# Patient Record
Sex: Male | Born: 1990
Health system: Southern US, Community
[De-identification: ages and names within clinical notes are randomized; demographics above are authoritative.]

## PROBLEM LIST (undated history)

## (undated) DIAGNOSIS — K5792 Diverticulitis of intestine, part unspecified, without perforation or abscess without bleeding: Secondary | ICD-10-CM

## (undated) DIAGNOSIS — I2699 Other pulmonary embolism without acute cor pulmonale: Secondary | ICD-10-CM

---

## 2012-10-06 ENCOUNTER — Emergency Department (HOSPITAL_BASED_OUTPATIENT_CLINIC_OR_DEPARTMENT_OTHER)
Admission: EM | Admit: 2012-10-06 | Discharge: 2012-10-06 | Disposition: A | Discharge status: Home / Self Care | Payer: Managed Care, Other (non HMO) | Attending: Emergency Medicine | Admitting: Emergency Medicine

## 2012-10-06 ENCOUNTER — Encounter (HOSPITAL_BASED_OUTPATIENT_CLINIC_OR_DEPARTMENT_OTHER): Payer: Self-pay | Admitting: *Deleted

## 2012-10-06 DIAGNOSIS — F17200 Nicotine dependence, unspecified, uncomplicated: Secondary | ICD-10-CM | POA: Insufficient documentation

## 2012-10-06 DIAGNOSIS — Z792 Long term (current) use of antibiotics: Secondary | ICD-10-CM | POA: Insufficient documentation

## 2012-10-06 DIAGNOSIS — K029 Dental caries, unspecified: Secondary | ICD-10-CM | POA: Insufficient documentation

## 2012-10-06 MED ORDER — OXYCODONE-ACETAMINOPHEN 5-325 MG PO TABS: 2.0000 | ORAL_TABLET | ORAL | Status: DC | PRN

## 2012-10-06 MED ORDER — PENICILLIN V POTASSIUM 500 MG PO TABS: 500.0000 mg | ORAL_TABLET | Freq: Three times a day (TID) | ORAL | Status: DC

## 2012-10-06 NOTE — ED Notes (Signed)
Pt. C/o upper right tooth hurting that started today. Denies any fevers. Took aleve one hour pta without relief. States he has issues on and off for past 8 months with the tooth.

## 2012-10-06 NOTE — ED Provider Notes (Signed)
History   This chart was scribed for Douglas Bucco, MD by Albertha Ghee Rifaie. This patient was seen in room MH10/MH10 and the patient's care was started at 9:47PM.    CSN: [id number]  Arrival date & time 10/06/12  2127   First MD Initiated Contact with Patient 10/06/12 2147      Chief Complaint  Patient presents with  . Dental Pain     The history is provided by the patient. No language interpreter was used.    [name] is a 21 y.o. male who presents to the Emergency Department complaining of unchanged constant, non-radiating right upper tooth pain onset two weeks ago. However pain got sever for the past 4 hours. He denies fever, chills, emesis, nausea, and any other chronic health problems. Patient is a current everyday smoker but denies alcohol use.   History reviewed. No pertinent past medical history.  History reviewed. No pertinent past surgical history.  No family history on file.  History  Substance Use Topics  . Smoking status: Current Every Day Smoker  . Smokeless tobacco: Not on file  . Alcohol Use: No      Review of Systems  Constitutional: Negative for fever, chills, diaphoresis and fatigue.  HENT: Negative for congestion, rhinorrhea and sneezing.   Eyes: Negative.   Respiratory: Negative for cough, chest tightness and shortness of breath.   Cardiovascular: Negative for chest pain and leg swelling.  Gastrointestinal: Negative for nausea, vomiting, abdominal pain, diarrhea and blood in stool.  Genitourinary: Negative for frequency, hematuria, flank pain and difficulty urinating.  Musculoskeletal: Negative for back pain and arthralgias.  Skin: Negative for rash.  Neurological: Negative for dizziness, speech difficulty, weakness, numbness and headaches.    Allergies  Review of patient's allergies indicates no known allergies.  Home Medications   Current Outpatient Rx  Name  Route  Sig  Dispense  Refill  . OXYCODONE-ACETAMINOPHEN 5-325 MG PO TABS  Oral   Take 2 tablets by mouth every 4 (four) hours as needed for pain.   15 tablet   0   . PENICILLIN V POTASSIUM 500 MG PO TABS   Oral   Take 1 tablet (500 mg total) by mouth 3 (three) times daily.   30 tablet   0     BP 164/101  Pulse 101  Temp 98.1 F (36.7 C) (Oral)  Resp 20  SpO2 100%  Physical Exam  Constitutional: He is oriented to person, place, and time. He appears well-developed and well-nourished.  HENT:  Head: Normocephalic and atraumatic.       Tenderness along the right upper first molar.  No induration or fluctuance.  No trismus.   Neck: Normal range of motion. Neck supple.  Cardiovascular: Normal rate.   Pulmonary/Chest: Effort normal.  Neurological: He is alert and oriented to person, place, and time.  Skin: Skin is warm and dry.    ED Course  Procedures (including critical care time)  DIAGNOSTIC STUDIES: Oxygen Saturation is 100% on room air, normal by my interpretation.    COORDINATION OF CARE: 9:55 PM Discussed treatment plan with pt at bedside and pt agreed to plan.    Labs Reviewed - No data to display No results found.   1. Dental caries       MDM  Pt started on abx, pain meds.  Advised to f/u with denist.  No evidence of abscess     I personally performed the services described in this documentation, which was scribed in my presence.  The recorded information has been reviewed and considered.      Douglas Bucco, MD 10/06/12 [phone]

## 2014-07-05 ENCOUNTER — Emergency Department (HOSPITAL_BASED_OUTPATIENT_CLINIC_OR_DEPARTMENT_OTHER)
Admission: EM | Admit: 2014-07-05 | Discharge: 2014-07-05 | Disposition: A | Discharge status: Home / Self Care | Payer: Managed Care, Other (non HMO) | Attending: Emergency Medicine | Admitting: Emergency Medicine

## 2014-07-05 ENCOUNTER — Encounter (HOSPITAL_BASED_OUTPATIENT_CLINIC_OR_DEPARTMENT_OTHER): Payer: Self-pay | Admitting: Emergency Medicine

## 2014-07-05 ENCOUNTER — Emergency Department (HOSPITAL_BASED_OUTPATIENT_CLINIC_OR_DEPARTMENT_OTHER): Payer: Managed Care, Other (non HMO)

## 2014-07-05 DIAGNOSIS — Z7901 Long term (current) use of anticoagulants: Secondary | ICD-10-CM | POA: Insufficient documentation

## 2014-07-05 DIAGNOSIS — J189 Pneumonia, unspecified organism: Secondary | ICD-10-CM | POA: Insufficient documentation

## 2014-07-05 DIAGNOSIS — Z792 Long term (current) use of antibiotics: Secondary | ICD-10-CM | POA: Insufficient documentation

## 2014-07-05 DIAGNOSIS — R197 Diarrhea, unspecified: Secondary | ICD-10-CM | POA: Insufficient documentation

## 2014-07-05 DIAGNOSIS — R000 Tachycardia, unspecified: Secondary | ICD-10-CM | POA: Insufficient documentation

## 2014-07-05 DIAGNOSIS — R509 Fever, unspecified: Secondary | ICD-10-CM | POA: Insufficient documentation

## 2014-07-05 DIAGNOSIS — Z87891 Personal history of nicotine dependence: Secondary | ICD-10-CM | POA: Insufficient documentation

## 2014-07-05 DIAGNOSIS — Z86711 Personal history of pulmonary embolism: Secondary | ICD-10-CM | POA: Insufficient documentation

## 2014-07-05 HISTORY — DX: Other pulmonary embolism without acute cor pulmonale: I26.99

## 2014-07-05 MED ORDER — LIDOCAINE HCL (PF) 1 % IJ SOLN
INTRAMUSCULAR | Status: AC
  Administered 2014-07-05: 2.1 mL
  Filled 2014-07-05: qty 5

## 2014-07-05 MED ORDER — LEVOFLOXACIN 500 MG PO TABS: 500.0000 mg | ORAL_TABLET | Freq: Every day | ORAL | Status: DC

## 2014-07-05 MED ORDER — AZITHROMYCIN 250 MG PO TABS
500.0000 mg | ORAL_TABLET | Freq: Once | ORAL | Status: AC
  Administered 2014-07-05: 500 mg via ORAL
  Filled 2014-07-05: qty 2

## 2014-07-05 MED ORDER — CEFTRIAXONE SODIUM 1 G IJ SOLR
1.0000 g | Freq: Once | INTRAMUSCULAR | Status: AC
  Administered 2014-07-05: 1 g via INTRAMUSCULAR
  Filled 2014-07-05: qty 10

## 2014-07-05 MED ORDER — GUAIFENESIN-CODEINE 100-10 MG/5ML PO SOLN
10.0000 mL | Freq: Once | ORAL | Status: AC
  Administered 2014-07-05: 10 mL via ORAL
  Filled 2014-07-05: qty 10

## 2014-07-05 NOTE — ED Provider Notes (Signed)
CSN: [id number]     Arrival date & time 07/05/14  1531 History  This chart was scribed for Raeford Razor, MD by Phillis Haggis, ED Scribe. This patient was seen in room MH09/MH09 and patient care was started at 5:03 PM.    Chief Complaint  Patient presents with  . Fever   Patient is a 23 y.o. male presenting with fever. The history is provided by the patient. No language interpreter was used.  Fever Associated symptoms: chest pain, congestion, cough, diarrhea and sore throat   Associated symptoms: no nausea    HPI Comments: Douglas Newman is a 23 y.o. male with a history of pulmonary embolism who presents to the Emergency Department complaining of fever tmax 101F onset 1 day ago. He states that he was in the hospital for blood clots in bilateral lungs 1 week ago at [facility] in [place]. He states that he was seen at Primary Care before his blood clots and they found fluid pockets on the right lung and he was told he had pneumonia. He reports that he is on Xarelto for the blood clot symptoms. He reports associated "hard" non-productive cough, generalized chest pain, sore throat, congestion, and diarrhea. He reports that his symptoms have been improving but worsened yesterday. He denies SOB or nausea. 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   Past Medical History  Diagnosis Date  . Pulmonary embolism    History reviewed. No pertinent past surgical history. No family history on file. History  Substance Use Topics  . Smoking status: Former Games developer  . Smokeless tobacco: Not on file  . Alcohol Use: No    Review of Systems  Constitutional: Positive for fever.  HENT: Positive for congestion and sore throat.   Respiratory: Positive for cough. Negative for shortness of breath.   Cardiovascular: Positive for chest pain.  Gastrointestinal: Positive for diarrhea. Negative for nausea.  All other systems reviewed and are negative.  Allergies  Review of  patient's allergies indicates no known allergies.  Home Medications   Prior to Admission medications   Medication Sig Start Date End Date Taking? Authorizing Provider  oxycodone (OXY-IR) 5 MG capsule Take 5 mg by mouth every 4 (four) hours as needed.   Yes Historical Provider, MD  Rivaroxaban (XARELTO) 15 MG TABS tablet Take 15 mg by mouth 2 (two) times daily with a meal.   Yes Historical Provider, MD  oxyCODONE-acetaminophen (PERCOCET/ROXICET) 5-325 MG per tablet Take 2 tablets by mouth every 4 (four) hours as needed for pain. 10/06/12   Rolan Bucco, MD  penicillin v potassium (VEETID) 500 MG tablet Take 1 tablet (500 mg total) by mouth 3 (three) times daily. 10/06/12   Rolan Bucco, MD   BP 125/88  Pulse 118  Temp(Src) 98.7 F (37.1 C) (Oral)  Resp 20  Ht 5\' 11"  (1.803 m)  Wt 270 lb (122.471 kg)  BMI 37.67 kg/m2  SpO2 98% Physical Exam  Nursing note and vitals reviewed. Constitutional: He appears well-developed and well-nourished. No distress.  HENT:  Head: Normocephalic and atraumatic.  Eyes: Conjunctivae are normal. Right eye exhibits no discharge. Left eye exhibits no discharge.  Neck: Neck supple.  Cardiovascular: Normal rate, regular rhythm and normal heart sounds.  Exam reveals no gallop and no friction rub.   No murmur heard. Pulmonary/Chest: Effort normal and breath sounds normal. No respiratory distress.  Mildly tachycardic. Diminished breath sounds left base. No increased work of breathing. Speaking in  complete sentences.   Abdominal: Soft. He exhibits no distension. There is no tenderness.  Musculoskeletal: He exhibits no edema and no tenderness.  Neurological: He is alert.  Skin: Skin is warm and dry.  Psychiatric: He has a normal mood and affect. His behavior is normal. Thought content normal.    ED Course  Procedures (including critical care time) DIAGNOSTIC STUDIES: Oxygen Saturation is 98% on room air, normal by my interpretation.    COORDINATION OF  CARE: 5:07 PM-Discussed treatment plan which includes anti-biotics, Rocephin shot and f/u if symptoms worsen with pt at bedside and pt agreed to plan.   Labs Review Labs Reviewed - No data to display  Imaging Review No results found.  Dg Chest 2 View  07/05/2014   CLINICAL DATA:  History of PE, fever/cough  EXAM: CHEST  2 VIEW  COMPARISON:  05/08/2014  FINDINGS: Patchy left lower lobe opacity, suspicious for pneumonia. Given the clinical history, pulmonary infarct is also possible. Suspected associated left pleural effusion. No pneumothorax.  Heart is normal in size.  Visualized osseous structures are within normal limits.  IMPRESSION: Left lower lobe opacity, suspicious for pneumonia or pulmonary infarct.  Left pleural effusion.   Electronically Signed   By: Charline BillsSriyesh  Krishnan M.D.   On: 07/05/2014 16:38    EKG Interpretation None      MDM   Final diagnoses:  Pneumonia, organism unspecified    22yM with CP and fever. Recently diagnosed PE on xarelto. CXR with opacity. Pneumonia versus pulmonary infarct. Clinically suspect pneumonia. Given rocephin/azithromycin in ED. For HCAP though, quinolone would be better outpt tx. Not hypoxic. Doesn't appear distressed. I feel appropriate for outpt tx.     I personally preformed the services scribed in my presence. The recorded information has been reviewed is accurate. Raeford RazorStephen [place], MD.    Raeford RazorStephen Alylah Blakney, MD 07/11/14 [id number]

## 2014-07-05 NOTE — ED Notes (Signed)
Pt reports just d/c from hospital 1 week pta for new onset blood clots in bilateral lungs.  Reports has had sore throat, cough, congestion, fever up to 101F, diarrhea.  On xarelto for PEs, denies any acute sob.

## 2014-07-05 NOTE — Discharge Instructions (Signed)

## 2019-01-17 ENCOUNTER — Emergency Department (HOSPITAL_BASED_OUTPATIENT_CLINIC_OR_DEPARTMENT_OTHER)
Admission: EM | Admit: 2019-01-17 | Discharge: 2019-01-17 | Disposition: A | Discharge status: Home / Self Care | Payer: No Typology Code available for payment source | Attending: Emergency Medicine | Admitting: Emergency Medicine

## 2019-01-17 ENCOUNTER — Emergency Department (HOSPITAL_BASED_OUTPATIENT_CLINIC_OR_DEPARTMENT_OTHER): Payer: No Typology Code available for payment source

## 2019-01-17 ENCOUNTER — Encounter (HOSPITAL_BASED_OUTPATIENT_CLINIC_OR_DEPARTMENT_OTHER): Payer: Self-pay

## 2019-01-17 ENCOUNTER — Other Ambulatory Visit: Payer: Self-pay

## 2019-01-17 DIAGNOSIS — I2699 Other pulmonary embolism without acute cor pulmonale: Secondary | ICD-10-CM

## 2019-01-17 DIAGNOSIS — Z87891 Personal history of nicotine dependence: Secondary | ICD-10-CM | POA: Diagnosis not present

## 2019-01-17 DIAGNOSIS — M549 Dorsalgia, unspecified: Secondary | ICD-10-CM | POA: Diagnosis present

## 2019-01-17 DIAGNOSIS — Z79899 Other long term (current) drug therapy: Secondary | ICD-10-CM | POA: Diagnosis not present

## 2019-01-17 LAB — CBC WITH DIFFERENTIAL/PLATELET
Abs Immature Granulocytes: 0.02 10*3/uL (ref 0.00–0.07)
BASOS ABS: 0 10*3/uL (ref 0.0–0.1)
Basophils Relative: 1 %
Eosinophils Absolute: 0.1 10*3/uL (ref 0.0–0.5)
Eosinophils Relative: 2 %
HEMATOCRIT: 45.2 % (ref 39.0–52.0)
Hemoglobin: 15.3 g/dL (ref 13.0–17.0)
IMMATURE GRANULOCYTES: 0 %
LYMPHS ABS: 1.9 10*3/uL (ref 0.7–4.0)
Lymphocytes Relative: 29 %
MCH: 30.9 pg (ref 26.0–34.0)
MCHC: 33.8 g/dL (ref 30.0–36.0)
MCV: 91.3 fL (ref 80.0–100.0)
Monocytes Absolute: 0.6 10*3/uL (ref 0.1–1.0)
Monocytes Relative: 9 %
NEUTROS ABS: 3.9 10*3/uL (ref 1.7–7.7)
NRBC: 0 % (ref 0.0–0.2)
Neutrophils Relative %: 59 %
Platelets: 238 10*3/uL (ref 150–400)
RBC: 4.95 MIL/uL (ref 4.22–5.81)
RDW: 13.3 % (ref 11.5–15.5)
WBC: 6.5 10*3/uL (ref 4.0–10.5)

## 2019-01-17 LAB — COMPREHENSIVE METABOLIC PANEL
ALT: 39 U/L (ref 0–44)
AST: 31 U/L (ref 15–41)
Albumin: 4.5 g/dL (ref 3.5–5.0)
Alkaline Phosphatase: 62 U/L (ref 38–126)
Anion gap: 8 (ref 5–15)
BUN: 16 mg/dL (ref 6–20)
CO2: 24 mmol/L (ref 22–32)
CREATININE: 0.9 mg/dL (ref 0.61–1.24)
Calcium: 9.6 mg/dL (ref 8.9–10.3)
Chloride: 104 mmol/L (ref 98–111)
GFR calc non Af Amer: >60 mL/min (ref >60)
Glucose, Bld: 86 mg/dL (ref 70–99)
POTASSIUM: 3.9 mmol/L (ref 3.5–5.1)
SODIUM: 136 mmol/L (ref 135–145)
Total Bilirubin: 0.7 mg/dL (ref 0.3–1.2)
Total Protein: 7.6 g/dL (ref 6.5–8.1)

## 2019-01-17 LAB — APTT: APTT: 41 s — AB (ref 24–36)

## 2019-01-17 LAB — PROTIME-INR
INR: 1.09
PROTHROMBIN TIME: 14.1 s (ref 11.4–15.2)

## 2019-01-17 MED ORDER — IOPAMIDOL (ISOVUE-370) INJECTION 76%
100.0000 mL | Freq: Once | INTRAVENOUS | Status: AC | PRN
  Administered 2019-01-17: 100 mL via INTRAVENOUS

## 2019-01-17 MED ORDER — RIVAROXABAN (XARELTO) VTE STARTER PACK (15 & 20 MG): ORAL_TABLET | ORAL | 0 refills | Status: DC

## 2019-01-17 MED FILL — XARELTO STARTER PACK: 15 & 20 | 30 days supply | Qty: 51 | Fill #0

## 2019-01-17 NOTE — ED Provider Notes (Signed)
[facility] EMERGENCY DEPARTMENT Provider Note   CSN: [id number] Arrival date & time: 01/17/19  1141   History   Chief Complaint Chief Complaint  Patient presents with  . Back Pain   HPI Douglas Newman is a 28 y.o. male with past medical history significant for pulmonary embolism who presents for evaluation of right-sided back pain.  Patient states he has felt right-sided back pain since yesterday evening.  Patient states he has had fluttering as well as pleuritic pain to right back.  Denies cough or hemoptysis.  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  Denies fever, chills, nausea, vomiting, chest pain, shortness of breath, abdominal pain, diarrhea, dysuria.  Patient states this pain feels similar to his previous pulmonary embolism.  Patient states he was in the hospital 2 weeks for his prior PE.  States he was not told he had a blood clotting disorder.  Patient states they told him that this most likely occurred secondary to his travel.  Denies lower leg swelling, tenderness, warmth, recent surgeries.  History obtained from patient.  No interpreter was used.  HPI  Past Medical History:  Diagnosis Date  . Pulmonary embolism (HCC)     There are no active problems to display for this patient.   History reviewed. No pertinent surgical history.      Home Medications    Prior to Admission medications   Medication Sig Start Date End Date Taking? Authorizing Provider  levofloxacin (LEVAQUIN) 500 MG tablet Take 1 tablet (500 mg total) by mouth daily. 07/05/14   Raeford RazorKohut, Stephen, MD  oxycodone (OXY-IR) 5 MG capsule Take 5 mg by mouth every 4 (four) hours as needed.    [provider]  oxyCODONE-acetaminophen (PERCOCET/ROXICET) 5-325 MG per tablet Take 2 tablets by mouth every 4 (four) hours as needed for pain. 10/06/12   Rolan BuccoBelfi, Melanie, MD  penicillin v potassium (VEETID) 500 MG tablet Take 1 tablet (500 mg total) by mouth 3  (three) times daily. 10/06/12   Rolan BuccoBelfi, Melanie, MD  Rivaroxaban 15 & 20 MG TBPK Take as directed on package: Start with one 15mg  tablet by mouth twice a day with food. On Day 22, switch to one 20mg  tablet once a day with food. 01/17/19   Shihab States A, PA-C    Family History No family history on file.  Social History Social History   Tobacco Use  . Smoking status: Former Games developermoker  . Smokeless tobacco: Former Engineer, waterUser  Substance Use Topics  . Alcohol use: No  . Drug use: No     Allergies   Patient has no known allergies.   Review of Systems Review of Systems  Constitutional: Negative.   HENT: Negative.   Respiratory: Negative.        Pleuritic pain to right upper back.  Cardiovascular: Negative.   Gastrointestinal: Negative.   Genitourinary: Negative.   Musculoskeletal: Positive for back pain.  Skin: Negative.   Neurological: Negative.   All other systems reviewed and are negative.    Physical Exam Updated Vital Signs BP 100/62 (BP Location: Right Arm)   Pulse 68   Temp 98.2 F (36.8 C) (Oral)   Resp 16   Ht 6' (1.829 m)   Wt 114.3 kg   SpO2 100%   BMI 34.18 kg/m   Physical Exam Vitals signs and nursing note reviewed.  Constitutional:      General: He is not in acute distress.  Appearance: He is well-developed. He is not diaphoretic.  HENT:     Head: Normocephalic and atraumatic.     Nose: Nose normal.     Mouth/Throat:     Mouth: Mucous membranes are moist.     Pharynx: Oropharynx is clear.  Eyes:     Pupils: Pupils are equal, round, and reactive to light.  Neck:     Musculoskeletal: Normal range of motion and neck supple.  Cardiovascular:     Rate and Rhythm: Normal rate and regular rhythm.     Pulses: Normal pulses.     Heart sounds: Normal heart sounds.  Pulmonary:     Effort: Pulmonary effort is normal. No respiratory distress.     Comments: Clear to auscultation bilaterally without wheeze, rhonchi or rales.  No accessory muscle usage.  No  tachypnea. Abdominal:     General: There is no distension.     Palpations: Abdomen is soft.     Comments: Soft, nontender without rebound or guarding.  No CVA tenderness.  Musculoskeletal: Normal range of motion.     Comments: Full range of motion of the T-spine and L-spine with flexion, hyperextension, and lateral flexion. No midline tenderness or stepoffs. No tenderness to palpation of the spinous processes of the T-spine or L-spine. No tenderness to palpation of the paraspinous muscles of the L-spine  Skin:    General: Skin is warm and dry.  Neurological:     Mental Status: He is alert.     Comments: Speech is clear and goal oriented, follows commands Normal 5/5 strength in upper and lower extremities bilaterally including dorsiflexion and plantar flexion, strong and equal grip strength Sensation normal to light and sharp touch Moves extremities without ataxia, coordination intact Normal gait Normal balance No Clonus    ED Treatments / Results  Labs (all labs ordered are listed, but only abnormal results are displayed) Labs Reviewed  APTT - Abnormal; Notable for the following components:      Result Value   aPTT 41 (*)    All other components within normal limits  CBC WITH DIFFERENTIAL/PLATELET  COMPREHENSIVE METABOLIC PANEL  PROTIME-INR    EKG None  Radiology Ct Angio Chest Pe W/cm &/or Wo Cm  Result Date: 01/17/2019 CLINICAL DATA:  Right mid back pain, history of PE EXAM: CT ANGIOGRAPHY CHEST WITH CONTRAST TECHNIQUE: Multidetector CT imaging of the chest was performed using the standard protocol during bolus administration of intravenous contrast. Multiplanar CT image reconstructions and MIPs were obtained to evaluate the vascular anatomy. CONTRAST:  ISOVUE-370 IOPAMIDOL (ISOVUE-370) INJECTION 76% COMPARISON:  Chest radiograph, 07/05/2014 FINDINGS: Cardiovascular: Positive examination for pulmonary embolism. There is segmental to subsegmental embolus present in the  bilateral lung bases, most notably in the left lower lobe. Normal heart size. No pericardial effusion. RV to LV ratio < 0.9. Mediastinum/Nodes: No enlarged mediastinal, hilar, or axillary lymph nodes. Thyroid gland, trachea, and esophagus demonstrate no significant findings. Lungs/Pleura: Scar-like opacity in the bilateral lung bases distal to the small emboli, generally in keeping with appearance of prior chest radiograph dated 07/05/2014, of uncertain acuity although possibly residua of prior infarction. No pleural effusion or pneumothorax. Upper Abdomen: No acute abnormality. Musculoskeletal: No chest wall abnormality. No acute or significant osseous findings. Review of the MIP images confirms the above findings. IMPRESSION: 1. Positive examination for pulmonary embolism. There is segmental to subsegmental embolus present in the bilateral lung bases, most notably in the left lower lobe. 2. Scar-like opacity in the bilateral lung bases  distal to the small emboli, generally in keeping with appearance of prior chest radiograph dated 07/05/2014. These are of uncertain acuity although possibly residua of prior infarction. 3. Findings are overall of uncertain acuity. Comparison to prior CT angiogram, if available, would be helpful to differentiate acute and chronic embolus. 4.  No CT evidence of right heart strain. These results were called by telephone at the time of interpretation on 01/17/2019 at 1:38 pm to Dr. Ralph Leyden , who verbally acknowledged these results. Electronically Signed   By: Lauralyn Primes M.D.   On: 01/17/2019 13:38    Procedures Procedures (including critical care time)  Medications Ordered in ED Medications  iopamidol (ISOVUE-370) 76 % injection 100 mL (100 mLs Intravenous Contrast Given 01/17/19 1319)    Initial Impression / Assessment and Plan / ED Course  I have reviewed the triage vital signs and the nursing notes.  Pertinent labs & imaging results that were available during my  care of the patient were reviewed by me and considered in my medical decision making (see chart for details).  28 year old male who appears otherwise well presents for evaluation of back pain.  Afebrile, nonseptic, non-ill-appearing.  Patient states feels similar to his previous pulmonary embolism.  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  No lower extremity edema, erythema, warmth, tenderness.  No tachycardia, tachypnea or hypoxia. No chest pain. Neurovascularly intact.  Neuromusculoskeletal exam. No loss of bowel or bladder control.  No concern for cauda equina.  No fever, night sweats, weight loss, h/o cancer, IVDU. EKG pending. Will obtain labs, CT to r/o PE and reevaluate.  CBC without leukocytosis, Metabolic panel without electrolyte, liver, renal abnormalities.  CT scan showed segmental to subsegmental bilateral pulmonary embolisms with scaring distal unable to determine acuity.  No heart strain.  Patient unsure "if I want to stay in the hospital."  Consulted with attending physician Dr. Fredderick Phenix who will see and evaluate patient to determine if candidate for outpatient Xarelto therapy.   On reevaluation patient without tachycardia, tachypnea or hypoxia.  Patient has decided against inpatient management.  Discussed risk versus benefit of inpatient vs outpatient management of PE.  Patient voices understanding of risk versus benefit and has chosen outpatient therapy.  Will be started on Xarelto starter pack.  Pharmacist has discussed Xarelto with patient.  No known head bleeds or clotting disorders. Discussed strict return precautions.  Patient voiced understanding of return precautions and agreeable for follow-up.  Patient hemodynamically stable at this time.  No tachycardia, tachypnea or hypoxia department.  Patient was seen and evaluated by attending, Dr. Fredderick Phenix.  She discussed with  patient inpatient vs outpatient management of PE.  Feels patient is good candidate for outpatient therapy.  Agrees with above treatment, plan and disposition.    Final Clinical Impressions(s) / ED Diagnoses   Final diagnoses:  Pulmonary embolism without acute cor pulmonale, unspecified chronicity, unspecified pulmonary embolism type [facility])    ED Discharge Orders         Ordered    Rivaroxaban 15 & 20 MG TBPK     01/17/19 1505           Agastya Meister A, PA-C 01/17/19 1710    Rolan Bucco, MD 01/18/19 1208

## 2019-01-17 NOTE — Discharge Instructions (Addendum)
You were diagnosed with a pulmonary embolism.  Started on Xarelto.  Help with your primary care provider over the next 2 to 3 days for reevaluation.  Return to the ED for any worsening symptoms such as chest pain, shortness of breath, worsening back pain, coughing up blood, cough.   Information on my medicine - XARELTO (rivaroxaban)  This medication education was reviewed with me or my healthcare representative as part of my discharge preparation.  The pharmacist that spoke with me during my hospital stay was:  Daylene Posey, [facility]  WHY WAS Douglas Newman PRESCRIBED FOR YOU? Xarelto was prescribed to treat blood clots that may have been found in the veins of your legs (deep vein thrombosis) or in your lungs (pulmonary embolism) and to reduce the risk of them occurring again.  WHAT DO YOU NEED TO KNOW ABOUT XARELTO? The starting dose is one 15 mg tablet taken TWICE daily with food for the FIRST 21 DAYS then on 02/07/2019  the dose is changed to one 20 mg tablet taken ONCE A DAY with your evening meal.  DO NOT stop taking Xarelto without talking to the health care provider who prescribed the medication.  Refill your prescription for 20 mg tablets before you run out.  After discharge, you should have regular check-up appointments with your healthcare provider that is prescribing your Xarelto.  In the future your dose may need to be changed if your kidney function changes by a significant amount.  WHAT DO YOU DO IF YOU MISS A DOSE? If you are taking Xarelto TWICE DAILY and you miss a dose, take it as soon as you remember. You may take two 15 mg tablets (total 30 mg) at the same time then resume your regularly scheduled 15 mg twice daily the next day.  If you are taking Xarelto ONCE DAILY and you miss a dose, take it as soon as you remember on the same day then continue your regularly scheduled once daily regimen the next day. Do not take two doses of Xarelto at the same time.   IMPORTANT SAFETY  INFORMATION Xarelto is a blood thinner medicine that can cause bleeding. You should call your healthcare provider right away if you experience any of the following: Bleeding from an injury or your nose that does not stop. Unusual colored urine (red or dark brown) or unusual colored stools (red or black). Unusual bruising for unknown reasons. A serious fall or if you hit your head (even if there is no bleeding).  Some medicines may interact with Xarelto and might increase your risk of bleeding while on Xarelto. To help avoid this, consult your healthcare provider or pharmacist prior to using any new prescription or non-prescription medications, including herbals, vitamins, non-steroidal anti-inflammatory drugs (NSAIDs) and supplements.  This website has more information on Xarelto: VisitDestination.com.br.

## 2019-01-17 NOTE — ED Notes (Signed)
Pt transported to CT at this time.

## 2019-01-17 NOTE — ED Triage Notes (Signed)
Pt c/o right mid back pain that he describes as a flutter felling-feels like wne he had a PE in the past-denies as pain-NAD-steady gait

## 2019-09-03 IMAGING — CT CT ANGIO CHEST
2 of 8 series · 18 of 36 positions shown · IV contrast (iopamidol)
Comparison: Chest radiograph, 07/05/2014

CLINICAL DATA: Right mid back pain, history of PE

EXAM:
CT ANGIOGRAPHY CHEST WITH CONTRAST
TECHNIQUE: Multidetector CT imaging of the chest was performed using the
standard protocol during bolus administration of intravenous
contrast. Multiplanar CT image reconstructions and MIPs were
obtained to evaluate the vascular anatomy.
CONTRAST:  100mL Z1R011-A2P IOPAMIDOL (Z1R011-A2P) INJECTION 76%

[Series 6: pe coronal mpr · coronal · 0.54mm/px · 1 of 151 slices shown]
[im 76/151  mediastinal]
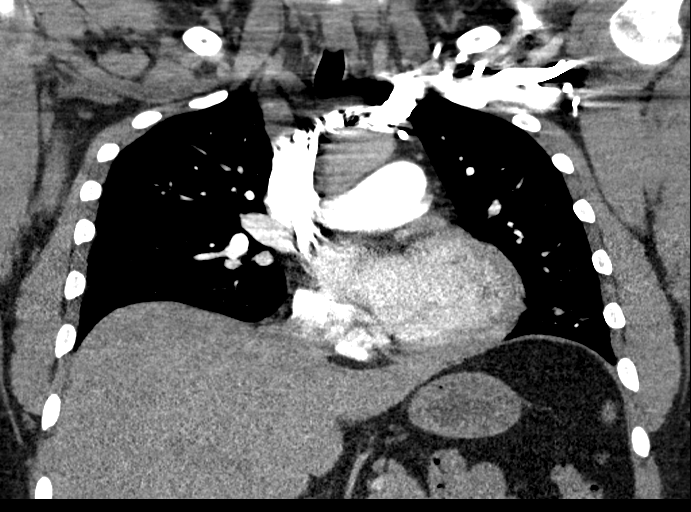

[Series 10: pe thins · axial · 0.76mm/px · z∈[+811,+1064]mm · 17 of 283 slices shown]
[im 15/283  lung]
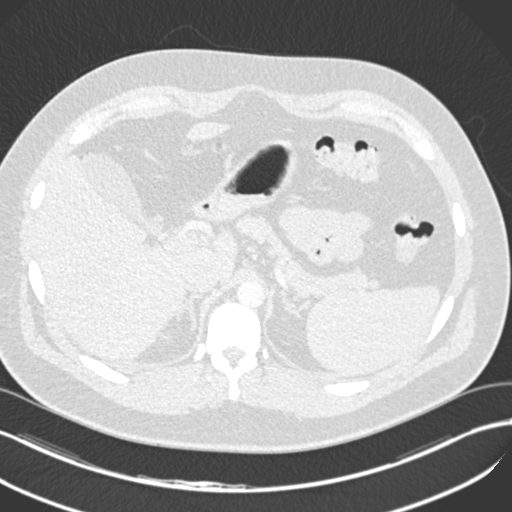
[im 30/283  mediastinal]
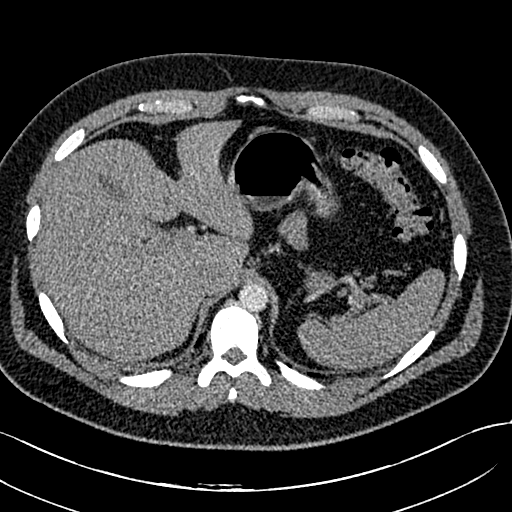
[im 45/283  lung]
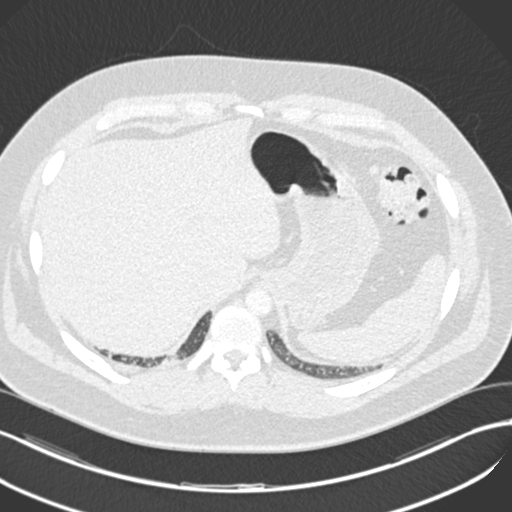
[im 60/283  mediastinal]
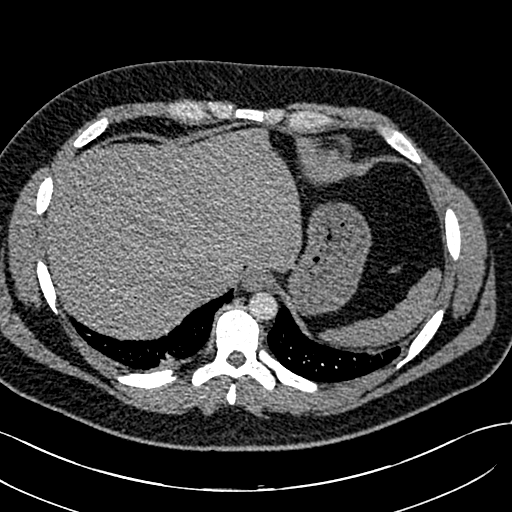
[im 75/283  lung]
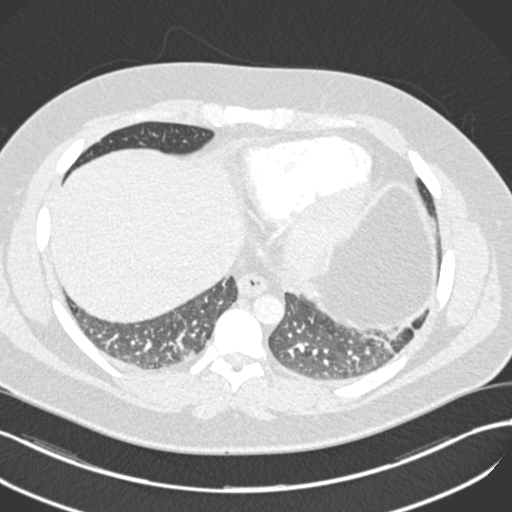
[im 90/283  mediastinal]
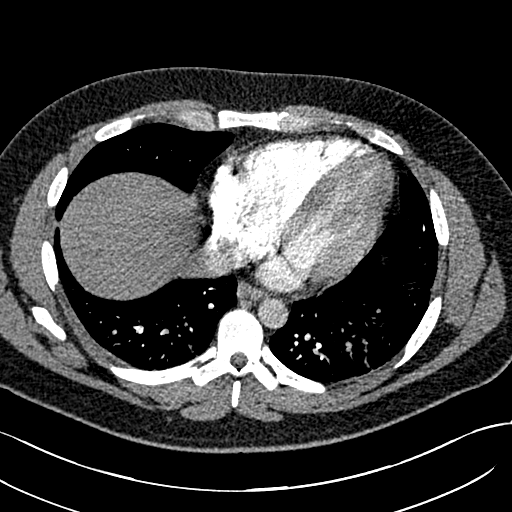
[im 104/283  lung]
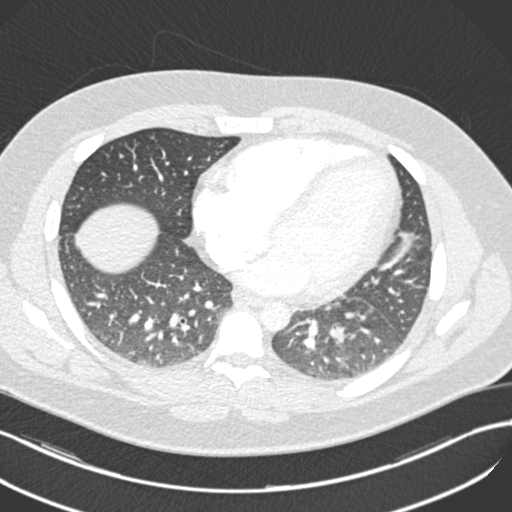
[im 119/283  mediastinal]
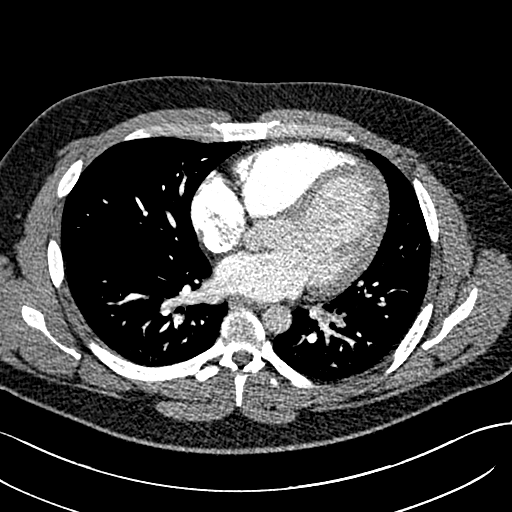
[im 149/283  lung]
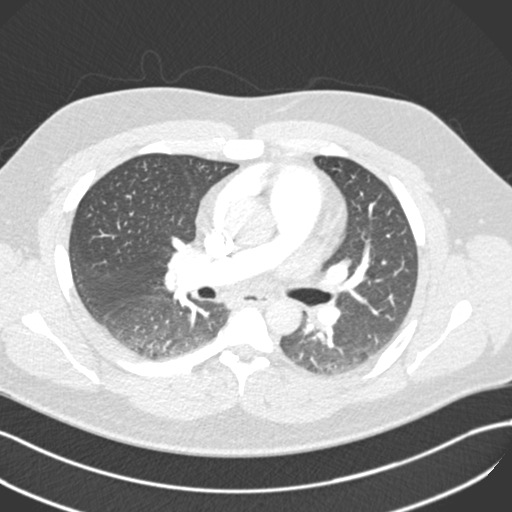
[im 164/283  mediastinal]
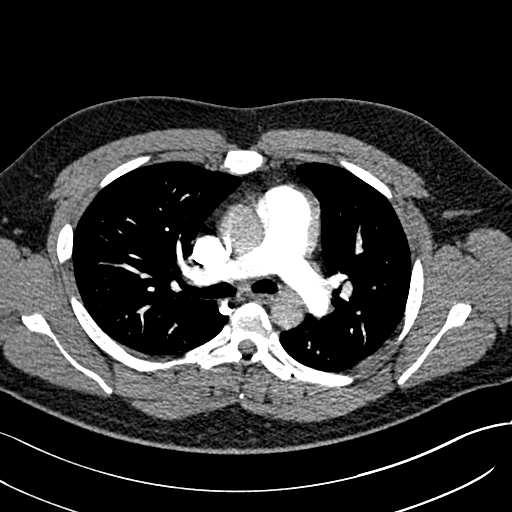
[im 179/283  lung]
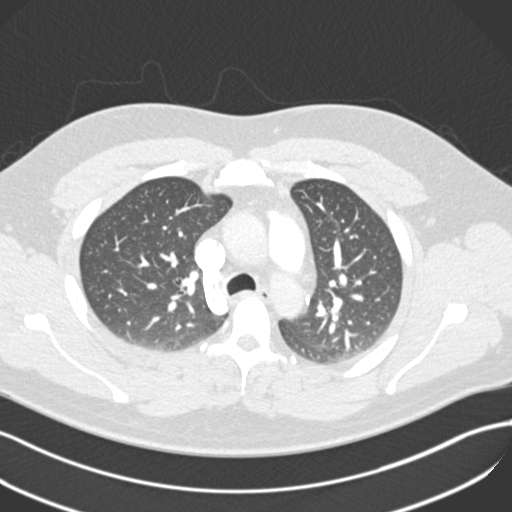
[im 193/283  mediastinal]
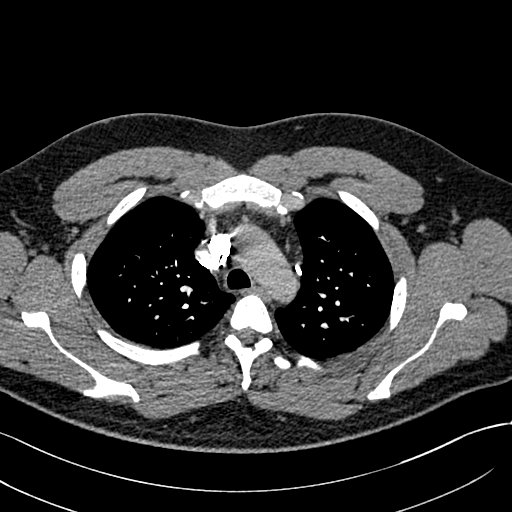
[im 208/283  lung]
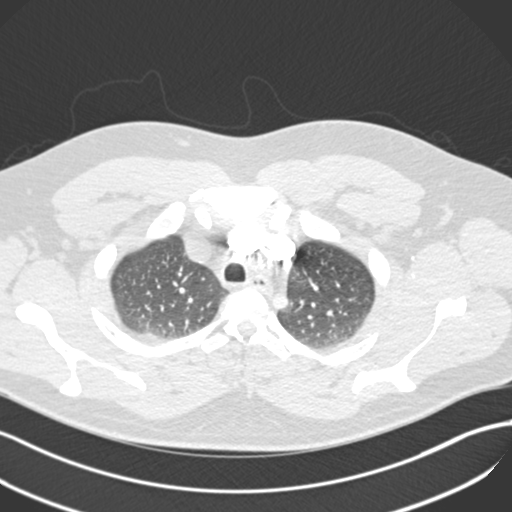
[im 223/283  mediastinal]
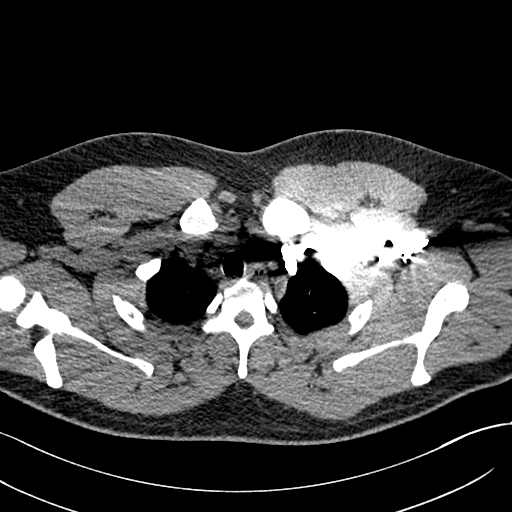
[im 238/283  lung]
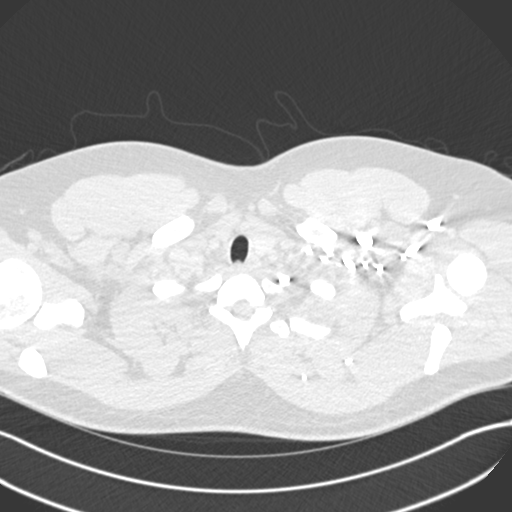
[im 253/283  mediastinal]
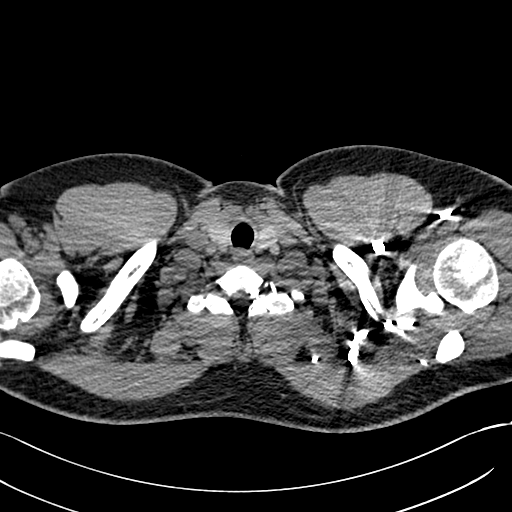
[im 268/283  lung]
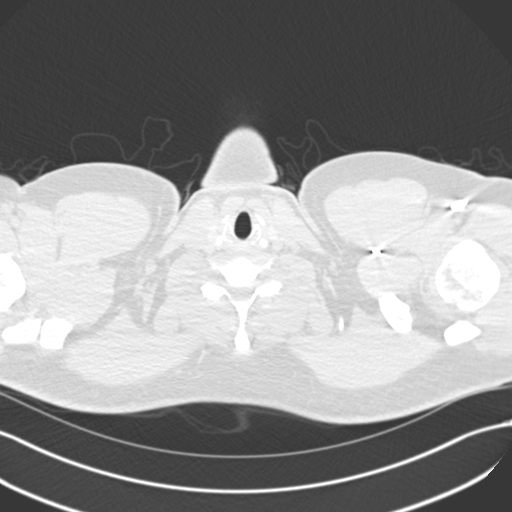

[18 of 36 positions shown; findings below may reference images not displayed]

FINDINGS: Cardiovascular: Positive examination for pulmonary embolism. There
is segmental to subsegmental embolus present in the bilateral lung
bases, most notably in the left lower lobe. Normal heart size. No
pericardial effusion. RV to LV ratio < 0.9.

Mediastinum/Nodes: No enlarged mediastinal, hilar, or axillary lymph
nodes. Thyroid gland, trachea, and esophagus demonstrate no
significant findings.

Lungs/Pleura: Scar-like opacity in the bilateral lung bases distal
to the small emboli, generally in keeping with appearance of prior
chest radiograph dated 07/05/2014, of uncertain acuity although
possibly residua of prior infarction. No pleural effusion or
pneumothorax.

Upper Abdomen: No acute abnormality.

Musculoskeletal: No chest wall abnormality. No acute or significant
osseous findings.

Review of the MIP images confirms the above findings.
IMPRESSION: 1. Positive examination for pulmonary embolism. There is segmental
to subsegmental embolus present in the bilateral lung bases, most
notably in the left lower lobe.

2. Scar-like opacity in the bilateral lung bases distal to the small
emboli, generally in keeping with appearance of prior chest
radiograph dated 07/05/2014. These are of uncertain acuity although
possibly residua of prior infarction.

3. Findings are overall of uncertain acuity. Comparison to prior CT
angiogram, if available, would be helpful to differentiate acute and
chronic embolus.

4.  No CT evidence of right heart strain.

These results were called by telephone at the time of interpretation
on 01/17/2019 at [DATE] to Dr. HOI SANG HUSNA , who verbally
acknowledged these results.

## 2020-02-07 ENCOUNTER — Emergency Department: Payer: No Typology Code available for payment source

## 2020-02-07 ENCOUNTER — Other Ambulatory Visit: Payer: Self-pay

## 2020-02-07 ENCOUNTER — Encounter: Payer: Self-pay | Admitting: *Deleted

## 2020-02-07 ENCOUNTER — Emergency Department
Admission: EM | Admit: 2020-02-07 | Discharge: 2020-02-07 | Disposition: A | Discharge status: Home / Self Care | Payer: No Typology Code available for payment source | Attending: Student in an Organized Health Care Education/Training Program | Admitting: Student in an Organized Health Care Education/Training Program

## 2020-02-07 DIAGNOSIS — Y999 Unspecified external cause status: Secondary | ICD-10-CM | POA: Insufficient documentation

## 2020-02-07 DIAGNOSIS — W010XXA Fall on same level from slipping, tripping and stumbling without subsequent striking against object, initial encounter: Secondary | ICD-10-CM | POA: Diagnosis not present

## 2020-02-07 DIAGNOSIS — S46912A Strain of unspecified muscle, fascia and tendon at shoulder and upper arm level, left arm, initial encounter: Secondary | ICD-10-CM

## 2020-02-07 DIAGNOSIS — Y929 Unspecified place or not applicable: Secondary | ICD-10-CM | POA: Diagnosis not present

## 2020-02-07 DIAGNOSIS — S4992XA Unspecified injury of left shoulder and upper arm, initial encounter: Secondary | ICD-10-CM | POA: Diagnosis present

## 2020-02-07 DIAGNOSIS — Y9364 Activity, baseball: Secondary | ICD-10-CM | POA: Insufficient documentation

## 2020-02-07 DIAGNOSIS — S4352XA Sprain of left acromioclavicular joint, initial encounter: Secondary | ICD-10-CM | POA: Insufficient documentation

## 2020-02-07 MED ORDER — IBUPROFEN 600 MG PO TABS
600.0000 mg | ORAL_TABLET | Freq: Once | ORAL | Status: DC
  Filled 2020-02-07: qty 1

## 2020-02-07 MED ORDER — TRAMADOL HCL 50 MG PO TABS: 50.0000 mg | ORAL_TABLET | Freq: Four times a day (QID) | ORAL | 0 refills | Status: AC | PRN

## 2020-02-07 MED ORDER — TRAMADOL HCL 50 MG PO TABS
50.0000 mg | ORAL_TABLET | Freq: Once | ORAL | Status: AC
Start: 2020-02-07 — End: 2020-02-07
  Administered 2020-02-07: 50 mg via ORAL
  Filled 2020-02-07: qty 1

## 2020-02-07 NOTE — ED Notes (Signed)
Pt was playing softball and did a front flip landing on L shoulder. States he took the tumble after feeling a painful pop in his R leg. Reports initially no shoulder pain but then saw it was visually altered from R shoulder and started having pain to site. Pt currently has ice applied to site. L radial pulse 2+; arm warm; appropriate color. L/injured shoulder shifted anteriorly.

## 2020-02-07 NOTE — Discharge Instructions (Signed)
Follow discharge care instruction take medication as directed.  Advised to follow-up at home station clinic in 1 week.

## 2020-02-07 NOTE — ED Provider Notes (Signed)
[facility] Emergency Department Provider Note   ____________________________________________   First MD Initiated Contact with Patient 02/07/20 1326     (approximate)  I have reviewed the triage vital signs and the nursing notes.   HISTORY  Chief Complaint Shoulder Pain    HPI Douglas Newman is a 29 y.o. male patient presents with left shoulder pain.  Patient that he was running in a softball tournament and fell on his left shoulder.  Patient points to the distal left clavicle as a source of pain.  Patient rates pain as a 4/10.  Patient described pain as "achy".  Patient the pain increases with abduction and overhead reaching.  Patient denies loss of sensation.  Patient is right-hand dominant.         Past Medical History:  Diagnosis Date  . Pulmonary embolism (HCC)     There are no problems to display for this patient.   History reviewed. No pertinent surgical history.  Prior to Admission medications   Medication Sig Start Date End Date Taking? Authorizing Provider  levofloxacin (LEVAQUIN) 500 MG tablet Take 1 tablet (500 mg total) by mouth daily. 07/05/14   Douglas Razor, MD  oxycodone (OXY-IR) 5 MG capsule Take 5 mg by mouth every 4 (four) hours as needed.    [provider]  oxyCODONE-acetaminophen (PERCOCET/ROXICET) 5-325 MG per tablet Take 2 tablets by mouth every 4 (four) hours as needed for pain. 10/06/12   Douglas Bucco, MD  penicillin v potassium (VEETID) 500 MG tablet Take 1 tablet (500 mg total) by mouth 3 (three) times daily. 10/06/12   Douglas Bucco, MD  Rivaroxaban 15 & 20 MG TBPK Take as directed on package: Start with one 15mg  tablet by mouth twice a day with food. On Day 22, switch to one 20mg  tablet once a day with food. 01/17/19   Henderly, Britni A, PA-C  traMADol (ULTRAM) 50 MG tablet Take 1 tablet (50 mg total) by mouth every 6 (six) hours as needed. 02/07/20 02/06/21  04/08/20, PA-C    Allergies Patient has no  known allergies.  No family history on file.  Social History Social History   Tobacco Use  . Smoking status: Former 04/08/21  . Smokeless tobacco: Former Douglas Newman Use Topics  . Alcohol use: No  . Drug use: No    Review of Systems Constitutional: No fever/chills Eyes: No visual changes. ENT: No sore throat. Cardiovascular: Denies chest pain. Respiratory: Denies shortness of breath. Gastrointestinal: No abdominal pain.  No nausea, no vomiting.  No diarrhea.  No constipation. Genitourinary: Negative for dysuria. Musculoskeletal: Left shoulder pain. Skin: Negative for rash. Neurological: Negative for headaches, focal weakness or numbness.  ____________________________________________   PHYSICAL EXAM:  VITAL SIGNS: ED Triage Vitals  Enc Vitals Group     BP 02/07/20 1316 123/90     Pulse Rate 02/07/20 1316 (!) 106     Resp 02/07/20 1316 18     Temp 02/07/20 1316 98.7 F (37.1 C)     Temp Source 02/07/20 1316 Oral     SpO2 02/07/20 1316 95 %     Weight 02/07/20 1317 250 lb (113.4 kg)     Height 02/07/20 1317 6' (1.829 m)     Head Circumference --      Peak Flow --      Pain Score 02/07/20 1317 4     Pain Loc --      Pain Edu? --      Excl.  in [place]? --    Constitutional: Alert and oriented. Well appearing and in no acute distress. Cardiovascular: Normal rate, regular rhythm. Grossly normal heart sounds.  Good peripheral circulation. Respiratory: Normal respiratory effort.  No retractions. Lungs CTAB. Gastrointestinal: Soft and nontender. No distention. No abdominal bruits. No CVA tenderness. Musculoskeletal: No obvious deformity to the left shoulder.  Patient has decreased range of motion with overhead reaching.  Patient is moderate guarding palpation of distal left clavicle.   Neurologic:  Normal speech and language. No gross focal neurologic deficits are appreciated. No gait instability. Skin:  Skin is warm, dry and intact. No rash noted. Psychiatric: Mood and  affect are normal. Speech and behavior are normal.  ____________________________________________   LABS (all labs ordered are listed, but only abnormal results are displayed)  Labs Reviewed - No data to display ____________________________________________  EKG   ____________________________________________  RADIOLOGY  ED MD interpretation:    Official radiology report(s): DG Shoulder Left  Result Date: 02/07/2020 CLINICAL DATA:  Left shoulder pain after fall. EXAM: LEFT SHOULDER - 2+ VIEW COMPARISON:  Chest radiograph 07/05/2014 FINDINGS: Left shoulder is located without a fracture. However, there is widening of the left AC joint. This is a marked change from the previous chest radiograph. Visualized left clavicle is intact. Visualized left ribs are intact. IMPRESSION: Left AC joint injury. Electronically Signed   By: Markus Daft M.D.   On: 02/07/2020 14:21    ____________________________________________   PROCEDURES  Procedure(s) performed (including Critical Care):  Procedures   ____________________________________________   INITIAL IMPRESSION / ASSESSMENT AND PLAN / ED COURSE  As part of my medical decision making, I reviewed the following data within the [place]     Patient presents with left shoulder pain secondary to playing baseball prior to arrival.  Discussed x-ray findings with patient showing moderate widening of the [facility] joint.  Patient placed in a sling and given discharge care instruction.  Patient advised to follow-up station in 1 week.  Douglas Newman was evaluated in Emergency Department on 02/07/2020 for the symptoms described in the history of present illness. He was evaluated in the context of the global COVID-19 pandemic, which necessitated consideration that the patient might be at risk for infection with the SARS-CoV-2 virus that causes COVID-19. Institutional protocols and algorithms that pertain to the evaluation of patients at risk for  COVID-19 are in a state of rapid change based on information released by regulatory bodies including the CDC and federal and state organizations. These policies and algorithms were followed during the patient's care in the ED.       ____________________________________________   FINAL CLINICAL IMPRESSION(S) / ED DIAGNOSES  Final diagnoses:  Strain of AC joint, left, initial encounter     ED Discharge Orders         Ordered    traMADol (ULTRAM) 50 MG tablet  Every 6 hours PRN     02/07/20 1513           Note:  This document was prepared using Dragon voice recognition software and may include unintentional dictation errors.    Sable Feil, PA-C 02/07/20 1520    Merlyn Lot, MD 02/07/20 1524

## 2020-02-07 NOTE — ED Notes (Signed)
Pt leaving for imaging. Pt A&Ox4. Steady while walking to room.

## 2020-02-07 NOTE — ED Notes (Addendum)
First Nurse Note: Pt to ED via POV stating that he was playing in a ball game and he fell and landed on his left shoulder, deformity noted. Pt is in NAD.

## 2020-02-07 NOTE — ED Notes (Signed)
Patient given ice pack for comfort

## 2020-02-07 NOTE — ED Triage Notes (Signed)
Patient c/o left shoulder pain. Patient state he was running in a softball tournament and fell on left shoulder. Patient is guarding left shoulder, holding arm at side.

## 2021-10-10 ENCOUNTER — Encounter (HOSPITAL_BASED_OUTPATIENT_CLINIC_OR_DEPARTMENT_OTHER): Payer: Self-pay | Admitting: *Deleted

## 2021-10-10 ENCOUNTER — Emergency Department (HOSPITAL_BASED_OUTPATIENT_CLINIC_OR_DEPARTMENT_OTHER)
Admission: EM | Admit: 2021-10-10 | Discharge: 2021-10-10 | Disposition: A | Discharge status: Home / Self Care | Payer: No Typology Code available for payment source | Attending: Emergency Medicine | Admitting: Emergency Medicine

## 2021-10-10 ENCOUNTER — Emergency Department (HOSPITAL_BASED_OUTPATIENT_CLINIC_OR_DEPARTMENT_OTHER): Payer: No Typology Code available for payment source

## 2021-10-10 ENCOUNTER — Other Ambulatory Visit: Payer: Self-pay

## 2021-10-10 DIAGNOSIS — K5732 Diverticulitis of large intestine without perforation or abscess without bleeding: Secondary | ICD-10-CM | POA: Insufficient documentation

## 2021-10-10 DIAGNOSIS — Z87891 Personal history of nicotine dependence: Secondary | ICD-10-CM | POA: Diagnosis not present

## 2021-10-10 DIAGNOSIS — N50812 Left testicular pain: Secondary | ICD-10-CM | POA: Diagnosis not present

## 2021-10-10 DIAGNOSIS — K5792 Diverticulitis of intestine, part unspecified, without perforation or abscess without bleeding: Secondary | ICD-10-CM

## 2021-10-10 DIAGNOSIS — R1032 Left lower quadrant pain: Secondary | ICD-10-CM | POA: Diagnosis present

## 2021-10-10 HISTORY — DX: Diverticulitis of intestine, part unspecified, without perforation or abscess without bleeding: K57.92

## 2021-10-10 LAB — CBC
HCT: 46.3 % (ref 39.0–52.0)
Hemoglobin: 15.9 g/dL (ref 13.0–17.0)
MCH: 31.2 pg (ref 26.0–34.0)
MCHC: 34.3 g/dL (ref 30.0–36.0)
MCV: 91 fL (ref 80.0–100.0)
Platelets: 410 10*3/uL — ABNORMAL HIGH (ref 150–400)
RBC: 5.09 MIL/uL (ref 4.22–5.81)
RDW: 13.2 % (ref 11.5–15.5)
WBC: 12.6 10*3/uL — ABNORMAL HIGH (ref 4.0–10.5)
nRBC: 0 % (ref 0.0–0.2)

## 2021-10-10 LAB — COMPREHENSIVE METABOLIC PANEL
ALT: 33 U/L (ref 0–44)
AST: 25 U/L (ref 15–41)
Albumin: 4.3 g/dL (ref 3.5–5.0)
Alkaline Phosphatase: 81 U/L (ref 38–126)
Anion gap: 5 (ref 5–15)
BUN: 14 mg/dL (ref 6–20)
CO2: 31 mmol/L (ref 22–32)
Calcium: 8.7 mg/dL — ABNORMAL LOW (ref 8.9–10.3)
Chloride: 98 mmol/L (ref 98–111)
Creatinine, Ser: 0.99 mg/dL (ref 0.61–1.24)
GFR, Estimated: >60 mL/min (ref >60)
Glucose, Bld: 93 mg/dL (ref 70–99)
Potassium: 4.2 mmol/L (ref 3.5–5.1)
Sodium: 134 mmol/L — ABNORMAL LOW (ref 135–145)
Total Bilirubin: 0.4 mg/dL (ref 0.3–1.2)
Total Protein: 8.2 g/dL — ABNORMAL HIGH (ref 6.5–8.1)

## 2021-10-10 LAB — URINALYSIS, ROUTINE W REFLEX MICROSCOPIC
Bilirubin Urine: NEGATIVE
Glucose, UA: NEGATIVE mg/dL
Hgb urine dipstick: NEGATIVE
Ketones, ur: NEGATIVE mg/dL
Leukocytes,Ua: NEGATIVE
Nitrite: NEGATIVE
Protein, ur: NEGATIVE mg/dL
Specific Gravity, Urine: 1.03 (ref 1.005–1.030)
pH: 6 (ref 5.0–8.0)

## 2021-10-10 LAB — LIPASE, BLOOD: Lipase: 36 U/L (ref 11–51)

## 2021-10-10 MED ORDER — AMOXICILLIN-POT CLAVULANATE 875-125 MG PO TABS: 1.0000 | ORAL_TABLET | Freq: Two times a day (BID) | ORAL | 0 refills | Status: DC

## 2021-10-10 MED ORDER — LACTATED RINGERS IV BOLUS
1000.0000 mL | Freq: Once | INTRAVENOUS | Status: AC
  Administered 2021-10-10: 1000 mL via INTRAVENOUS

## 2021-10-10 MED ORDER — IOHEXOL 300 MG/ML  SOLN
100.0000 mL | Freq: Once | INTRAMUSCULAR | Status: AC | PRN
  Administered 2021-10-10: 100 mL via INTRAVENOUS

## 2021-10-10 NOTE — ED Provider Notes (Signed)
[facility] EMERGENCY DEPARTMENT Provider Note   CSN: [id number] Arrival date & time: 10/10/21  1704     History Chief Complaint  Patient presents with   Abdominal Pain    Douglas Newman is a 30 y.o. male with history of diverticulitis presents to the emergency department for evaluation of left lower quadrant pain since yesterday.  Patient reports the pain is intermittent and sharp.  He had an episode of diverticulitis a year and a half ago and reports the pain feel similar to this.  Additionally, he reports the pain radiates into his left testicle, and that is a same pain presentation as a last time he had diverticulitis.  He denies any diarrhea or constipation, but mentions his stool has been "mucousy".  He denies any nausea, vomiting, hematuria, dysuria, fevers, hematochezia, or bright red blood per rectum.  Denies any testicular pain or swelling.  Denies any scrotal swelling.  Medical history includes diverticulitis and pulmonary embolism.  Denies any surgical history.  Denies any daily medications.  No known drug allergies.  Former smoker.  Denies any EtOH or drug use.   Abdominal Pain Associated symptoms: no chest pain, no chills, no constipation, no cough, no diarrhea, no dysuria, no fever, no hematuria, no nausea, no shortness of breath, no sore throat and no vomiting       Past Medical History:  Diagnosis Date   Diverticulitis    Pulmonary embolism ([place])     There are no problems to display for this patient.   History reviewed. No pertinent surgical history.     No family history on file.  Social History   Tobacco Use   Smoking status: Former   Smokeless tobacco: Former  Scientific laboratory technician Use: Every day  Substance Use Topics   Alcohol use: No   Drug use: No    Home Medications Prior to Admission medications   Medication Sig Start Date End Date Taking? Authorizing Provider  amoxicillin-clavulanate (AUGMENTIN) 875-125 MG tablet Take 1 tablet by  mouth every 12 (twelve) hours. 10/10/21  Yes Sherrell Puller, PA-C  oxycodone (OXY-IR) 5 MG capsule Take 5 mg by mouth every 4 (four) hours as needed.    [provider]  oxyCODONE-acetaminophen (PERCOCET/ROXICET) 5-325 MG per tablet Take 2 tablets by mouth every 4 (four) hours as needed for pain. 10/06/12   Malvin Johns, MD  Rivaroxaban 15 & 20 MG TBPK Take as directed on package: Start with one 15mg  tablet by mouth twice a day with food. On Day 22, switch to one 20mg  tablet once a day with food. 01/17/19   Henderly, Britni A, PA-C    Allergies    Patient has no known allergies.  Review of Systems   Review of Systems  Constitutional:  Negative for chills and fever.  HENT:  Negative for ear pain and sore throat.   Eyes:  Negative for pain and visual disturbance.  Respiratory:  Negative for cough and shortness of breath.   Cardiovascular:  Negative for chest pain and palpitations.  Gastrointestinal:  Positive for abdominal pain. Negative for constipation, diarrhea, nausea and vomiting.  Genitourinary:  Negative for difficulty urinating, dysuria, hematuria, penile pain, penile swelling, scrotal swelling and testicular pain.  Musculoskeletal:  Negative for arthralgias and back pain.  Skin:  Negative for color change and rash.  Neurological:  Negative for seizures and syncope.  All other systems reviewed and are negative.  Physical Exam Updated Vital Signs BP 109/86 (BP Location: Left Arm)  Pulse (!) 109   Temp 100.3 F (37.9 C) (Oral) Comment: pt declines tylenol at this time, states he will take at home  Resp 16   Ht 5\' 11"  (1.803 m)   Wt 127 kg   SpO2 100%   BMI 39.05 kg/m   Physical Exam Vitals and nursing note reviewed. Exam conducted with a chaperone present , Thayer Ohm).  Constitutional:      General: He is not in acute distress.    Appearance: Normal appearance. He is not toxic-appearing.  HENT:     Head: Normocephalic and atraumatic.  Eyes:     General: No  scleral icterus. Cardiovascular:     Rate and Rhythm: Regular rhythm. Tachycardia present.  Pulmonary:     Effort: Pulmonary effort is normal.     Breath sounds: Normal breath sounds.  Abdominal:     General: Abdomen is protuberant. Bowel sounds are normal.     Palpations: Abdomen is soft.     Tenderness: There is abdominal tenderness in the left lower quadrant. There is no guarding or rebound. Negative signs include Rovsing's sign.     Comments: Abdominal exam limited secondary to patient's body habitus.  Abdominal striae present otherwise no overlying skin changes, erythema, ecchymosis, or rashes noted to the area.  Normal bowel sounds.  Patient is mildly tender to the left lower quadrant.  No guarding or rebound.  Genitourinary:    Testes:        Right: Tenderness or swelling not present.        Left: Tenderness present. Swelling not present.     Comments: High riding left teste. No hernias palpated in bilateral inguinal canals. Right teste lower riding than normal. Patient reports he doesn't know how his testicles usually look. He experienced mild tenderness to palpation of his left testes, but none on the right. No obvious scrotal or testicular swelling. No overlying skin changes. No penile drip or rash noted.  Musculoskeletal:        General: No deformity.     Cervical back: Normal range of motion.  Skin:    General: Skin is warm and dry.  Neurological:     General: No focal deficit present.     Mental Status: He is alert. Mental status is at baseline.    ED Results / Procedures / Treatments   Labs (all labs ordered are listed, but only abnormal results are displayed) Labs Reviewed  COMPREHENSIVE METABOLIC PANEL - Abnormal; Notable for the following components:      Result Value   Sodium 134 (*)    Calcium 8.7 (*)    Total Protein 8.2 (*)    All other components within normal limits  CBC - Abnormal; Notable for the following components:   WBC 12.6 (*)    Platelets 410 (*)     All other components within normal limits  LIPASE, BLOOD  URINALYSIS, ROUTINE W REFLEX MICROSCOPIC    EKG None  Radiology CT Abdomen Pelvis W Contrast  Result Date: 10/10/2021 CLINICAL DATA:  Lower abdominal pain radiating into right testicle for 2 days. Evaluate for diverticulitis. EXAM: CT ABDOMEN AND PELVIS WITH CONTRAST TECHNIQUE: Multidetector CT imaging of the abdomen and pelvis was performed using the standard protocol following bolus administration of intravenous contrast. CONTRAST:  13/06/2021 OMNIPAQUE IOHEXOL 300 MG/ML  SOLN COMPARISON:  None. FINDINGS: Lower chest: Mild platelike atelectasis noted within the lung bases. Hepatobiliary: No focal liver abnormality is seen. No gallstones, gallbladder wall thickening, or biliary dilatation. Pancreas: Unremarkable.  No pancreatic ductal dilatation or surrounding inflammatory changes. Spleen: Normal in size without focal abnormality. Adrenals/Urinary Tract: Normal adrenal glands. No nephrolithiasis, hydronephrosis, or mass. Urinary bladder appears unremarkable. Stomach/Bowel: Stomach is normal. The appendix is visualized and is within normal limits. Scattered distal colonic diverticula noted. Wall thickening and inflammation involving the proximal sigmoid colon is identified, image 79/2. Imaging findings are compatible with acute diverticulitis. No significant free fluid or fluid collection. The remaining bowel loops are unremarkable. Vascular/Lymphatic: No significant vascular findings are present. No enlarged abdominal or pelvic lymph nodes. Reproductive: Prostate is unremarkable. Other: No free fluid or fluid collections. Musculoskeletal: No acute or significant osseous findings. IMPRESSION: Examination is positive for acute sigmoid diverticulitis. No perforation or abscess identified. Electronically Signed   By: Kerby Moors M.D.   On: 10/10/2021 20:48    Procedures Procedures   Medications Ordered in ED Medications  lactated ringers bolus  1,000 mL (0 mLs Intravenous Stopped 10/10/21 2143)  iohexol (OMNIPAQUE) 300 MG/ML solution 100 mL (100 mLs Intravenous Contrast Given 10/10/21 2002)    ED Course  I have reviewed the triage vital signs and the nursing notes.  Pertinent labs & imaging results that were available during my care of the patient were reviewed by me and considered in my medical decision making (see chart for details).  30 year old male with history of diverticulitis presents emergency department for evaluation of left lower quadrant pain for the past day.  Differential diagnosis includes but is not limited to testicular torsion, diverticulitis, SBO, viral gastroenteritis, IBD, UTI, renal stone.  On presentation, patient is well-appearing.  Mild to moderate left lower quadrant tenderness palpation, but no guarding or rebound.  Patient has a high riding left testicle, and a low riding right testicle.  Patient reports he does not know how his testicles usually look.  A testicular torsion rule out ultrasound was placed, but patient refused at this time as he wanted to go home.  I discussed with him the risks of not getting ultrasound such as infertility, loss of his testicle, death, sepsis.  Patient expressed understanding and reported he would follow-up with the [place] about it..  1 L lactated Ringer's ordered.  Patient declined any pain medication at this time.  I personally reviewed and interpreted the patient's labs and imaging.  Urinalysis negative.  Lipase negative.  CBC shows mildly elevated white blood cell count of 12.6, otherwise no anemia seen.  Sodium 134, patient can replenish outpatient.  No other electrolyte abnormalities visualized on CMP.  CT abdomen shows examination is positive for acute sigmoid diverticulitis. No perforation or abscess identified.  The patient is mildly tachycardic at 109, most likely due to patient's pain and slightly elevated temperature at 100.3 Fahrenheit.  No acute distress.  Abdomen exam  unchanged.  Patient reports he is feeling better and would like to go home.  Addressed the ultrasound to rule out testicular torsion with him again, patient adamantly declines.  Prescribed Augmentin to take twice daily for the next 7 days for acute diverticulitis.  Recommended staying well-hydrated with fluids and Tylenol and/or ibuprofen for pain relief.  Additional information on diverticulitis included discharge to work.  Strict return precautions given.  Patient agrees with plan.  Patient is stable and being discharged home in good condition.    MDM Rules/Calculators/A&P  Final Clinical Impression(s) / ED Diagnoses Final diagnoses:  Diverticulitis    Rx / DC Orders ED Discharge Orders          Ordered  amoxicillin-clavulanate (AUGMENTIN) 875-125 MG tablet  Every 12 hours        10/10/21 2238             Sherrell Puller, PA-C 10/11/21 1431    Drenda Freeze, MD 10/11/21 [phone]

## 2021-10-10 NOTE — ED Notes (Signed)
PA at bedside conversing with pt and pt's family.

## 2021-10-10 NOTE — Discharge Instructions (Addendum)
You were seen here today and diagnosed with diverticulitis.  You have been given the antibiotics called Augmentin.  Take as prescribed and finish the entire course.  Additionally, you may take Tylenol and/or ibuprofen as needed for pain.  [facility]  If you have any worsening testicle pain, swelling, please return to the nearest emergency department for evaluation.  If any concern, new or worsening symptoms, please return to the nearest emergency department.

## 2021-10-10 NOTE — ED Triage Notes (Signed)
C/o left lower abd pain which radiates into right testicle  x 2 days hx of diverticulitis , reports "feels the same"

## 2021-10-10 NOTE — ED Notes (Signed)
Up to b/r, steady gait, attempting urine sample. Denies urinary sx, or flank or back pain, NV, or fever. C/o LLQ abd pain. Also orange, clumpy diarrhea. H/o diverticulitis, feels the same.

## 2022-08-29 ENCOUNTER — Other Ambulatory Visit: Payer: Self-pay

## 2022-08-29 ENCOUNTER — Emergency Department (HOSPITAL_BASED_OUTPATIENT_CLINIC_OR_DEPARTMENT_OTHER)
Admission: EM | Admit: 2022-08-29 | Discharge: 2022-08-29 | Disposition: A | Discharge status: Home / Self Care | Payer: No Typology Code available for payment source | Attending: Emergency Medicine | Admitting: Emergency Medicine

## 2022-08-29 ENCOUNTER — Emergency Department (HOSPITAL_BASED_OUTPATIENT_CLINIC_OR_DEPARTMENT_OTHER): Payer: No Typology Code available for payment source

## 2022-08-29 DIAGNOSIS — R109 Unspecified abdominal pain: Secondary | ICD-10-CM | POA: Diagnosis present

## 2022-08-29 DIAGNOSIS — N132 Hydronephrosis with renal and ureteral calculous obstruction: Secondary | ICD-10-CM | POA: Diagnosis not present

## 2022-08-29 DIAGNOSIS — N200 Calculus of kidney: Secondary | ICD-10-CM

## 2022-08-29 LAB — CBC WITH DIFFERENTIAL/PLATELET
Abs Immature Granulocytes: 0.06 10*3/uL (ref 0.00–0.07)
Basophils Absolute: 0.1 10*3/uL (ref 0.0–0.1)
Basophils Relative: 1 %
Eosinophils Absolute: 0.3 10*3/uL (ref 0.0–0.5)
Eosinophils Relative: 3 %
HCT: 48.8 % (ref 39.0–52.0)
Hemoglobin: 16.9 g/dL (ref 13.0–17.0)
Immature Granulocytes: 1 %
Lymphocytes Relative: 22 %
Lymphs Abs: 2.3 10*3/uL (ref 0.7–4.0)
MCH: 30.8 pg (ref 26.0–34.0)
MCHC: 34.6 g/dL (ref 30.0–36.0)
MCV: 88.9 fL (ref 80.0–100.0)
Monocytes Absolute: 0.8 10*3/uL (ref 0.1–1.0)
Monocytes Relative: 7 %
Neutro Abs: 7.2 10*3/uL (ref 1.7–7.7)
Neutrophils Relative %: 66 %
Platelets: 440 10*3/uL — ABNORMAL HIGH (ref 150–400)
RBC: 5.49 MIL/uL (ref 4.22–5.81)
RDW: 13.4 % (ref 11.5–15.5)
WBC: 10.7 10*3/uL — ABNORMAL HIGH (ref 4.0–10.5)
nRBC: 0 % (ref 0.0–0.2)

## 2022-08-29 LAB — URINALYSIS, MICROSCOPIC (REFLEX)

## 2022-08-29 LAB — COMPREHENSIVE METABOLIC PANEL
ALT: 29 U/L (ref 0–44)
AST: 27 U/L (ref 15–41)
Albumin: 4.1 g/dL (ref 3.5–5.0)
Alkaline Phosphatase: 85 U/L (ref 38–126)
Anion gap: 9 (ref 5–15)
BUN: 15 mg/dL (ref 6–20)
CO2: 23 mmol/L (ref 22–32)
Calcium: 9 mg/dL (ref 8.9–10.3)
Chloride: 106 mmol/L (ref 98–111)
Creatinine, Ser: 1.11 mg/dL (ref 0.61–1.24)
GFR, Estimated: >60 mL/min (ref >60)
Glucose, Bld: 114 mg/dL — ABNORMAL HIGH (ref 70–99)
Potassium: 4.3 mmol/L (ref 3.5–5.1)
Sodium: 138 mmol/L (ref 135–145)
Total Bilirubin: 0.6 mg/dL (ref 0.3–1.2)
Total Protein: 8.2 g/dL — ABNORMAL HIGH (ref 6.5–8.1)

## 2022-08-29 LAB — URINALYSIS, ROUTINE W REFLEX MICROSCOPIC
Glucose, UA: NEGATIVE mg/dL
Ketones, ur: NEGATIVE mg/dL
Leukocytes,Ua: NEGATIVE
Nitrite: NEGATIVE
Protein, ur: 100 mg/dL — AB
Specific Gravity, Urine: >=1.03 (ref 1.005–1.030)
pH: 5.5 (ref 5.0–8.0)

## 2022-08-29 MED ORDER — OXYCODONE-ACETAMINOPHEN 5-325 MG PO TABS: 1.0000 | ORAL_TABLET | Freq: Three times a day (TID) | ORAL | 0 refills | Status: DC | PRN

## 2022-08-29 MED ORDER — ONDANSETRON 4 MG PO TBDP: 4.0000 mg | ORAL_TABLET | Freq: Three times a day (TID) | ORAL | 0 refills | Status: DC | PRN

## 2022-08-29 MED ORDER — HYDROMORPHONE HCL 1 MG/ML IJ SOLN
0.5000 mg | Freq: Once | INTRAMUSCULAR | Status: AC
  Administered 2022-08-29: 0.5 mg via INTRAVENOUS
  Filled 2022-08-29: qty 1

## 2022-08-29 MED ORDER — KETOROLAC TROMETHAMINE 15 MG/ML IJ SOLN
15.0000 mg | Freq: Once | INTRAMUSCULAR | Status: AC
Start: 2022-08-29 — End: 2022-08-29
  Administered 2022-08-29: 15 mg via INTRAVENOUS
  Filled 2022-08-29: qty 1

## 2022-08-29 MED ORDER — ONDANSETRON HCL 4 MG/2ML IJ SOLN
4.0000 mg | Freq: Once | INTRAMUSCULAR | Status: AC
  Administered 2022-08-29: 4 mg via INTRAVENOUS
  Filled 2022-08-29: qty 2

## 2022-08-29 NOTE — ED Triage Notes (Signed)
C/O left flank pain and lower back pain saw a chiropractor over the weekend no relief; stated hx of clots in both lungs 6 years ago; states some shortness of breath; c/o nausea as well.

## 2022-08-29 NOTE — ED Provider Notes (Signed)
[facility] EMERGENCY DEPARTMENT Provider Note   CSN: [id number] Arrival date & time: 08/29/22  0854     History  Chief Complaint  Patient presents with   Flank Pain   Shortness of Breath    Douglas Newman is a 31 y.o. male.   Flank Pain Associated symptoms include shortness of breath.  Shortness of Breath Patient presents with left flank/low back pain.  Began yesterday.  Saw chiropractor over the weekend.  No relief.  Worsening today.  Up to the bank more now.  No shortness of breath but does feel as if it hurts to breathe a little bit.  No dysuria.  Does have nausea.  Has not had pains like this before.  No fevers.     Home Medications Prior to Admission medications   Medication Sig Start Date End Date Taking? Authorizing Provider  ondansetron (ZOFRAN-ODT) 4 MG disintegrating tablet Take 1 tablet (4 mg total) by mouth every 8 (eight) hours as needed for nausea or vomiting. 08/29/22  Yes Davonna Belling, MD  oxyCODONE-acetaminophen (PERCOCET/ROXICET) 5-325 MG tablet Take 1-2 tablets by mouth every 8 (eight) hours as needed for severe pain. 08/29/22  Yes Davonna Belling, MD  amoxicillin-clavulanate (AUGMENTIN) 875-125 MG tablet Take 1 tablet by mouth every 12 (twelve) hours. 10/10/21   Sherrell Puller, PA-C  Rivaroxaban 15 & 20 MG TBPK Take as directed on package: Start with one 15mg  tablet by mouth twice a day with food. On Day 22, switch to one 20mg  tablet once a day with food. 01/17/19   Henderly, Britni A, PA-C      Allergies    Patient has no known allergies.    Review of Systems   Review of Systems  Respiratory:  Positive for shortness of breath.   Genitourinary:  Positive for flank pain.    Physical Exam Updated Vital Signs BP 116/70   Pulse (!) 157   Temp 97.9 F (36.6 C) (Oral)   Resp 18   Ht 6' (1.829 m)   Wt 136.1 kg   SpO2 98%   BMI 40.69 kg/m  Physical Exam Vitals and nursing note reviewed.  Constitutional:      Comments: Patient appears  uncomfortable.  Pulmonary:     Breath sounds: Normal breath sounds. No rales.  Chest:     Chest wall: No tenderness.  Abdominal:     Tenderness: There is abdominal tenderness.     Comments: Mild lower flank tenderness on left.  No definite CVA tenderness.  Musculoskeletal:     Right lower leg: No edema.     Left lower leg: No edema.  Skin:    General: Skin is warm.     Capillary Refill: Capillary refill takes less than 2 seconds.  Neurological:     Mental Status: He is alert and oriented to person, place, and time.     ED Results / Procedures / Treatments   Labs (all labs ordered are listed, but only abnormal results are displayed) Labs Reviewed  URINALYSIS, ROUTINE W REFLEX MICROSCOPIC - Abnormal; Notable for the following components:      Result Value   APPearance HAZY (*)    Hgb urine dipstick TRACE (*)    Bilirubin Urine SMALL (*)    Protein, ur 100 (*)    All other components within normal limits  CBC WITH DIFFERENTIAL/PLATELET - Abnormal; Notable for the following components:   WBC 10.7 (*)    Platelets 440 (*)    All other components within normal  limits  COMPREHENSIVE METABOLIC PANEL - Abnormal; Notable for the following components:   Glucose, Bld 114 (*)    Total Protein 8.2 (*)    All other components within normal limits  URINALYSIS, MICROSCOPIC (REFLEX) - Abnormal; Notable for the following components:   Bacteria, UA RARE (*)    All other components within normal limits    EKG None  Radiology CT Renal Stone Study  Result Date: 08/29/2022 CLINICAL DATA:  Flank pain. EXAM: CT ABDOMEN AND PELVIS WITHOUT CONTRAST TECHNIQUE: Multidetector CT imaging of the abdomen and pelvis was performed following the standard protocol without IV contrast. RADIATION DOSE REDUCTION: This exam was performed according to the departmental dose-optimization program which includes automated exposure control, adjustment of the mA and/or kV according to patient size and/or use of  iterative reconstruction technique. COMPARISON:  October 10, 2021. FINDINGS: Lower chest: No acute abnormality. Hepatobiliary: No focal liver abnormality is seen. No gallstones, gallbladder wall thickening, or biliary dilatation. Pancreas: Unremarkable. No pancreatic ductal dilatation or surrounding inflammatory changes. Spleen: Normal in size without focal abnormality. Adrenals/Urinary Tract: Adrenal glands appear normal. Right kidney and ureter are unremarkable. Minimal left hydroureteronephrosis is noted secondary to 3 mm calculus in the distal left ureter. The urinary bladder is decompressed. Stomach/Bowel: Stomach is within normal limits. Appendix appears normal. No evidence of bowel wall thickening, distention, or inflammatory changes. Vascular/Lymphatic: No significant vascular findings are present. No enlarged abdominal or pelvic lymph nodes. Reproductive: Prostate is unremarkable. Other: No abdominal wall hernia or abnormality. No abdominopelvic ascites. Musculoskeletal: No acute or significant osseous findings. IMPRESSION: Minimal left hydroureteronephrosis is noted secondary to 3 mm calculus in the distal left ureter. Electronically Signed   By: Marijo Conception M.D.   On: 08/29/2022 09:54    Procedures Procedures    Medications Ordered in ED Medications  ondansetron (ZOFRAN) injection 4 mg (4 mg Intravenous Given 08/29/22 0921)  HYDROmorphone (DILAUDID) injection 0.5 mg (0.5 mg Intravenous Given 08/29/22 0920)  ketorolac (TORADOL) 15 MG/ML injection 15 mg (15 mg Intravenous Given 08/29/22 1004)    ED Course/ Medical Decision Making/ A&P                           Medical Decision Making Amount and/or Complexity of Data Reviewed Labs: ordered. Radiology: ordered.  Risk Prescription drug management.   Left flank pain.  Began a couple days ago.  Patient saw chiropractor.  Does have history of pulmonary embolisms.  No longer anticoagulation.  However this pain appears lower.  Doubt  pulmonary embolism as cause.  Urine does show some hematuria without infection.  CT angiography done and does show kidney stone.  I think this most likely cause of pain.  Less likely other causes such as zoster or diverticulitis.  Feels better after treatment initially with Dilaudid and then with Toradol.  Appears stable for discharge home with urology follow-up.  No infection.        Final Clinical Impression(s) / ED Diagnoses Final diagnoses:  Kidney stone    Rx / DC Orders ED Discharge Orders          Ordered    ondansetron (ZOFRAN-ODT) 4 MG disintegrating tablet  Every 8 hours PRN        08/29/22 1056    oxyCODONE-acetaminophen (PERCOCET/ROXICET) 5-325 MG tablet  Every 8 hours PRN        08/29/22 1056              Amon Costilla,  Ovid Curd, MD 08/29/22 1056

## 2022-09-11 ENCOUNTER — Encounter (HOSPITAL_COMMUNITY): Payer: Self-pay

## 2022-09-11 ENCOUNTER — Inpatient Hospital Stay (HOSPITAL_BASED_OUTPATIENT_CLINIC_OR_DEPARTMENT_OTHER)
Admission: EM | Admit: 2022-09-11 | Discharge: 2022-09-14 | DRG: 392 | Disposition: A | Discharge status: Home / Self Care | Payer: No Typology Code available for payment source | Attending: Family Medicine | Admitting: Family Medicine

## 2022-09-11 ENCOUNTER — Encounter (HOSPITAL_BASED_OUTPATIENT_CLINIC_OR_DEPARTMENT_OTHER): Payer: Self-pay | Admitting: Pediatrics

## 2022-09-11 ENCOUNTER — Other Ambulatory Visit: Payer: Self-pay

## 2022-09-11 ENCOUNTER — Emergency Department (HOSPITAL_BASED_OUTPATIENT_CLINIC_OR_DEPARTMENT_OTHER): Payer: No Typology Code available for payment source

## 2022-09-11 DIAGNOSIS — K5792 Diverticulitis of intestine, part unspecified, without perforation or abscess without bleeding: Secondary | ICD-10-CM

## 2022-09-11 DIAGNOSIS — R7989 Other specified abnormal findings of blood chemistry: Secondary | ICD-10-CM

## 2022-09-11 DIAGNOSIS — Z87891 Personal history of nicotine dependence: Secondary | ICD-10-CM | POA: Diagnosis not present

## 2022-09-11 DIAGNOSIS — E8809 Other disorders of plasma-protein metabolism, not elsewhere classified: Secondary | ICD-10-CM | POA: Diagnosis not present

## 2022-09-11 DIAGNOSIS — D72829 Elevated white blood cell count, unspecified: Secondary | ICD-10-CM | POA: Diagnosis not present

## 2022-09-11 DIAGNOSIS — K5732 Diverticulitis of large intestine without perforation or abscess without bleeding: Secondary | ICD-10-CM | POA: Diagnosis present

## 2022-09-11 DIAGNOSIS — N200 Calculus of kidney: Secondary | ICD-10-CM | POA: Diagnosis present

## 2022-09-11 DIAGNOSIS — Z6841 Body Mass Index (BMI) 40.0 and over, adult: Secondary | ICD-10-CM

## 2022-09-11 DIAGNOSIS — J302 Other seasonal allergic rhinitis: Secondary | ICD-10-CM | POA: Diagnosis present

## 2022-09-11 DIAGNOSIS — Z86711 Personal history of pulmonary embolism: Secondary | ICD-10-CM | POA: Diagnosis not present

## 2022-09-11 DIAGNOSIS — E66813 Obesity, class 3: Secondary | ICD-10-CM

## 2022-09-11 DIAGNOSIS — R109 Unspecified abdominal pain: Secondary | ICD-10-CM | POA: Diagnosis present

## 2022-09-11 LAB — COMPREHENSIVE METABOLIC PANEL
ALT: 76 U/L — ABNORMAL HIGH (ref 0–44)
AST: 49 U/L — ABNORMAL HIGH (ref 15–41)
Albumin: 3.6 g/dL (ref 3.5–5.0)
Alkaline Phosphatase: 64 U/L (ref 38–126)
Anion gap: 6 (ref 5–15)
BUN: 12 mg/dL (ref 6–20)
CO2: 27 mmol/L (ref 22–32)
Calcium: 8.7 mg/dL — ABNORMAL LOW (ref 8.9–10.3)
Chloride: 106 mmol/L (ref 98–111)
Creatinine, Ser: 0.95 mg/dL (ref 0.61–1.24)
GFR, Estimated: >60 mL/min (ref >60)
Glucose, Bld: 98 mg/dL (ref 70–99)
Potassium: 3.6 mmol/L (ref 3.5–5.1)
Sodium: 139 mmol/L (ref 135–145)
Total Bilirubin: 0.5 mg/dL (ref 0.3–1.2)
Total Protein: 7.5 g/dL (ref 6.5–8.1)

## 2022-09-11 LAB — LIPASE, BLOOD: Lipase: 35 U/L (ref 11–51)

## 2022-09-11 LAB — CBC
HCT: 41.9 % (ref 39.0–52.0)
Hemoglobin: 14.4 g/dL (ref 13.0–17.0)
MCH: 30.6 pg (ref 26.0–34.0)
MCHC: 34.4 g/dL (ref 30.0–36.0)
MCV: 89 fL (ref 80.0–100.0)
Platelets: 363 10*3/uL (ref 150–400)
RBC: 4.71 MIL/uL (ref 4.22–5.81)
RDW: 13.1 % (ref 11.5–15.5)
WBC: 13.2 10*3/uL — ABNORMAL HIGH (ref 4.0–10.5)
nRBC: 0 % (ref 0.0–0.2)

## 2022-09-11 LAB — MAGNESIUM: Magnesium: 2 mg/dL (ref 1.7–2.4)

## 2022-09-11 LAB — URINALYSIS, ROUTINE W REFLEX MICROSCOPIC
Bilirubin Urine: NEGATIVE
Glucose, UA: NEGATIVE mg/dL
Hgb urine dipstick: NEGATIVE
Ketones, ur: NEGATIVE mg/dL
Leukocytes,Ua: NEGATIVE
Nitrite: NEGATIVE
Protein, ur: NEGATIVE mg/dL
Specific Gravity, Urine: >=1.03 (ref 1.005–1.030)
pH: 5.5 (ref 5.0–8.0)

## 2022-09-11 LAB — TSH: TSH: 2.235 u[IU]/mL (ref 0.350–4.500)

## 2022-09-11 LAB — PHOSPHORUS: Phosphorus: 3.9 mg/dL (ref 2.5–4.6)

## 2022-09-11 MED ORDER — POTASSIUM CHLORIDE IN NACL 20-0.9 MEQ/L-% IV SOLN
INTRAVENOUS | Status: DC
  Filled 2022-09-11 (×8): qty 1000

## 2022-09-11 MED ORDER — ONDANSETRON HCL 4 MG/2ML IJ SOLN: 4.0000 mg | Freq: Four times a day (QID) | INTRAMUSCULAR | Status: DC | PRN

## 2022-09-11 MED ORDER — PIPERACILLIN-TAZOBACTAM 3.375 G IVPB 30 MIN
3.3750 g | Freq: Once | INTRAVENOUS | Status: AC
  Administered 2022-09-11: 3.375 g via INTRAVENOUS
  Filled 2022-09-11: qty 50

## 2022-09-11 MED ORDER — ENOXAPARIN SODIUM 80 MG/0.8ML IJ SOSY
70.0000 mg | PREFILLED_SYRINGE | INTRAMUSCULAR | Status: DC
  Administered 2022-09-11 – 2022-09-12 (×2): 70 mg via SUBCUTANEOUS
  Filled 2022-09-11 (×2): qty 0.8

## 2022-09-11 MED ORDER — SODIUM CHLORIDE 0.9 % IV BOLUS
1000.0000 mL | Freq: Once | INTRAVENOUS | Status: AC
  Administered 2022-09-11: 1000 mL via INTRAVENOUS

## 2022-09-11 MED ORDER — MORPHINE SULFATE (PF) 4 MG/ML IV SOLN
4.0000 mg | Freq: Once | INTRAVENOUS | Status: AC
  Administered 2022-09-11: 4 mg via INTRAVENOUS
  Filled 2022-09-11: qty 1

## 2022-09-11 MED ORDER — MORPHINE SULFATE (PF) 4 MG/ML IV SOLN
4.0000 mg | INTRAVENOUS | Status: DC | PRN
  Administered 2022-09-11 – 2022-09-12 (×2): 4 mg via INTRAVENOUS
  Filled 2022-09-11 (×2): qty 1

## 2022-09-11 MED ORDER — BISACODYL 10 MG RE SUPP: 10.0000 mg | Freq: Every day | RECTAL | Status: DC | PRN

## 2022-09-11 MED ORDER — IOHEXOL 300 MG/ML  SOLN
100.0000 mL | Freq: Once | INTRAMUSCULAR | Status: AC | PRN
  Administered 2022-09-11: 100 mL via INTRAVENOUS

## 2022-09-11 MED ORDER — HYDRALAZINE HCL 20 MG/ML IJ SOLN: 10.0000 mg | Freq: Four times a day (QID) | INTRAMUSCULAR | Status: DC | PRN

## 2022-09-11 MED ORDER — ACETAMINOPHEN 650 MG RE SUPP: 650.0000 mg | Freq: Four times a day (QID) | RECTAL | Status: DC | PRN

## 2022-09-11 MED ORDER — ACETAMINOPHEN 325 MG PO TABS
650.0000 mg | ORAL_TABLET | Freq: Four times a day (QID) | ORAL | Status: DC | PRN
  Administered 2022-09-11 – 2022-09-12 (×2): 650 mg via ORAL
  Filled 2022-09-11 (×2): qty 2

## 2022-09-11 MED ORDER — ONDANSETRON HCL 4 MG/2ML IJ SOLN
4.0000 mg | Freq: Once | INTRAMUSCULAR | Status: AC
  Administered 2022-09-11: 4 mg via INTRAVENOUS
  Filled 2022-09-11: qty 2

## 2022-09-11 MED ORDER — POLYETHYLENE GLYCOL 3350 17 G PO PACK: 17.0000 g | PACK | Freq: Every day | ORAL | Status: DC | PRN

## 2022-09-11 MED ORDER — OXYCODONE HCL 5 MG PO TABS
5.0000 mg | ORAL_TABLET | ORAL | Status: DC | PRN
  Administered 2022-09-11 – 2022-09-13 (×4): 5 mg via ORAL
  Filled 2022-09-11 (×5): qty 1

## 2022-09-11 MED ORDER — SODIUM CHLORIDE 0.9 % IV SOLN: INTRAVENOUS | Status: DC | PRN

## 2022-09-11 MED ORDER — ONDANSETRON HCL 4 MG PO TABS: 4.0000 mg | ORAL_TABLET | Freq: Four times a day (QID) | ORAL | Status: DC | PRN

## 2022-09-11 MED ORDER — PIPERACILLIN-TAZOBACTAM 3.375 G IVPB
3.3750 g | Freq: Three times a day (TID) | INTRAVENOUS | Status: DC
  Administered 2022-09-11 – 2022-09-14 (×8): 3.375 g via INTRAVENOUS
  Filled 2022-09-11 (×9): qty 50

## 2022-09-11 NOTE — Progress Notes (Signed)
Plan of Care Note for accepted transfer   Patient: Douglas Newman MRN: [id number]   [place]: 09/11/2022  Facility requesting transfer: [facility].  Requesting Provider: Aletta Edouard, MD. Reason for transfer: Acute diverticulitis. Facility course:  Per EDP: Chief Complaint  Patient presents with   Abdominal Pain    Douglas Newman is a 31 y.o. male.  He is complaining of about a week of some loose stools and left lower quadrant abdominal pain its been getting progressively worse.  Associated with some anorexia.  Nausea no vomiting.  No blood in his stool that he is aware of.  No clear fever.  He was here about 2 weeks ago for a kidney stone on the left although he thinks he passed it 2 days after being seen and was pain-free after that.  He has had diverticulitis in the past and he says this feels like that.  The pain is 8 out of 10 comes in waves.  Lipase, blood [[id number]]   Collected: 09/11/22 1024   Updated: 09/11/22 1109   Specimen Type: Blood    Lipase 35 U/L  Comprehensive metabolic panel [[id number]] (Abnormal)   Collected: 09/11/22 1024   Updated: 09/11/22 1109   Specimen Type: Blood    Sodium 139 mmol/L   Potassium 3.6 mmol/L   Chloride 106 mmol/L   CO2 27 mmol/L   Glucose, Bld 98 mg/dL   BUN 12 mg/dL   Creatinine, Ser 0.95 mg/dL   Calcium 8.7 Low  mg/dL   Total Protein 7.5 g/dL   Albumin 3.6 g/dL   AST 49 High  U/L   ALT 76 High  U/L   Alkaline Phosphatase 64 U/L   Total Bilirubin 0.5 mg/dL   GFR, Estimated >60 mL/min   Anion gap 6  Urinalysis, Routine w reflex microscopic Urine, Clean Catch [[id number]]   Collected: 09/11/22 1024   Updated: 09/11/22 1100   Specimen Source: Urine, Clean Catch    Color, Urine YELLOW   APPearance CLEAR   Specific Gravity, Urine >=1.030   pH 5.5   Glucose, UA NEGATIVE mg/dL   Hgb urine dipstick NEGATIVE   Bilirubin Urine NEGATIVE   Ketones, ur NEGATIVE mg/dL   Protein, ur NEGATIVE mg/dL   Nitrite NEGATIVE   Leukocytes,Ua  NEGATIVE  CBC [[id number]] (Abnormal)   Collected: 09/11/22 1024   Updated: 09/11/22 1046   Specimen Type: Blood    WBC 13.2 High  K/uL   RBC 4.71 MIL/uL   Hemoglobin 14.4 g/dL   HCT 41.9 %   MCV 89.0 fL   MCH 30.6 pg   MCHC 34.4 g/dL   RDW 13.1 %   Platelets 363 K/uL   Imaging: CT abdomen/pelvis with contrast IMPRESSION: Acute diverticulitis of the left lower quadrant descending colon. No evidence of abscess. There is 1 extraluminal focus of gas which could represent a tiny contained perforation. No distant free air.     Electronically Signed   By: Audie Pinto M.D.   On: 09/11/2022 11:39  Plan of care: The patient received Zosyn, morphine and ondansetron along with 1000 mL of normal saline bolus.  Dr. Marlou Starks  from general surgery was contacted.  Please notify him or [facility] surgeon on-call when the patient arrives to the hospital.  He is accepted for admission to [facility]  unit, at [facility]..    Author: Reubin Milan, MD 09/11/2022  Check [email or web address] for on-call coverage.  Nursing staff, Please call [facility] number on Amion as  soon as patient's arrival, so appropriate admitting provider can evaluate the pt.

## 2022-09-11 NOTE — ED Notes (Signed)
ED TO INPATIENT HANDOFF REPORT  ED Nurse Name and Phone #: Byard Carranza [place], RN  S Name/Age/Gender Douglas Newman 31 y.o. male Room/Bed: MH11/MH11  Code Status   Code Status: Not on file  Home/SNF/Other Home Patient oriented to: self, place, time, and situation Is this baseline? Yes   Triage Complete: Triage complete  Chief Complaint Acute diverticulitis [K57.92]  Triage Note C/O abdominal pain in the LLQ region for almost a week along w/ mucousy  foul smelling stool; reported hx of diverticulitis and symptoms feels similar.    Allergies No Known Allergies  Level of Care/Admitting Diagnosis ED Disposition     ED Disposition  Admit   Condition  --   Comment  Hospital Area: [facility] [[id number]]  Level of Care: Med-Surg [16]  May admit patient to [facility] or [facility] if equivalent level of care is available:: No  Interfacility transfer: Yes  Covid Evaluation: Asymptomatic - no recent exposure (last 10 days) testing not required  Diagnosis: Acute diverticulitis [7564332]  Admitting Physician: Bobette Mo [[id number]]  Attending Physician: Bobette Mo [[id number]]  Certification:: I certify this patient will need inpatient services for at least 2 midnights  Estimated Length of Stay: 2          B Medical/Surgery History Past Medical History:  Diagnosis Date   Diverticulitis    Pulmonary embolism (HCC)    History reviewed. No pertinent surgical history.   A IV Location/Drains/Wounds Patient Lines/Drains/Airways Status     Active Line/Drains/Airways     Name Placement date Placement time Site Days   Peripheral IV 09/11/22 20 G Right Antecubital 09/11/22  1023  Antecubital  less than 1            Intake/Output Last 24 hours  Intake/Output Summary (Last 24 hours) at 09/11/2022 1448 Last data filed at 09/11/2022 1352 Gross per 24 hour  Intake 1058.46 ml  Output --  Net 1058.46 ml    Labs/Imaging Results for orders  placed or performed during the hospital encounter of 09/11/22 (from the past 48 hour(s))  Lipase, blood     Status: None   Collection Time: 09/11/22 10:24 AM  Result Value Ref Range   Lipase 35 11 - 51 U/L    Comment: Performed at [facility], [address] [place], [place] [address]  Comprehensive metabolic panel     Status: Abnormal   Collection Time: 09/11/22 10:24 AM  Result Value Ref Range   Sodium 139 135 - 145 mmol/L   Potassium 3.6 3.5 - 5.1 mmol/L   Chloride 106 98 - 111 mmol/L   CO2 27 22 - 32 mmol/L   Glucose, Bld 98 70 - 99 mg/dL    Comment: Glucose reference range applies only to samples taken after fasting for at least 8 hours.   BUN 12 6 - 20 mg/dL   Creatinine, Ser 3.23 0.61 - 1.24 mg/dL   Calcium 8.7 (L) 8.9 - 10.3 mg/dL   Total Protein 7.5 6.5 - 8.1 g/dL   Albumin 3.6 3.5 - 5.0 g/dL   AST 49 (H) 15 - 41 U/L   ALT 76 (H) 0 - 44 U/L   Alkaline Phosphatase 64 38 - 126 U/L   Total Bilirubin 0.5 0.3 - 1.2 mg/dL   GFR, Estimated >55 >73 mL/min    Comment: (NOTE) Calculated using the CKD-EPI Creatinine Equation (2021)    Anion gap 6 5 - 15    Comment: Performed at [facility], [address] [place], [place] [address]  CBC     Status: Abnormal   Collection Time: 09/11/22 10:24 AM  Result Value Ref Range   WBC 13.2 (H) 4.0 - 10.5 K/uL   RBC 4.71 4.22 - 5.81 MIL/uL   Hemoglobin 14.4 13.0 - 17.0 g/dL   HCT 83.7 29.0 - 21.1 %   MCV 89.0 80.0 - 100.0 fL   MCH 30.6 26.0 - 34.0 pg   MCHC 34.4 30.0 - 36.0 g/dL   RDW 15.5 20.8 - 02.2 %   Platelets 363 150 - 400 K/uL   nRBC 0.0 0.0 - 0.2 %    Comment: Performed at [facility], [address] [place], [place] [address]  Urinalysis, Routine w reflex microscopic Urine, Clean Catch     Status: None   Collection Time: 09/11/22 10:24 AM  Result Value Ref Range   Color, Urine YELLOW YELLOW   APPearance CLEAR CLEAR   Specific Gravity, Urine >=1.030 1.005 - 1.030   pH 5.5 5.0 - 8.0    Glucose, UA NEGATIVE NEGATIVE mg/dL   Hgb urine dipstick NEGATIVE NEGATIVE   Bilirubin Urine NEGATIVE NEGATIVE   Ketones, ur NEGATIVE NEGATIVE mg/dL   Protein, ur NEGATIVE NEGATIVE mg/dL   Nitrite NEGATIVE NEGATIVE   Leukocytes,Ua NEGATIVE NEGATIVE    Comment: Microscopic not done on urines with negative protein, blood, leukocytes, nitrite, or glucose < 500 mg/dL. Performed at [facility], [address], [place], [place] [address]   Magnesium     Status: None   Collection Time: 09/11/22 10:24 AM  Result Value Ref Range   Magnesium 2.0 1.7 - 2.4 mg/dL    Comment: Performed at [facility], [address] [place], [place] [address]  Phosphorus     Status: None   Collection Time: 09/11/22 10:24 AM  Result Value Ref Range   Phosphorus 3.9 2.5 - 4.6 mg/dL    Comment: Performed at [facility], [address], [place], [place] [address]   CT Abdomen Pelvis W Contrast  Result Date: 09/11/2022 CLINICAL DATA:  Left lower quadrant abdominal pain. EXAM: CT ABDOMEN AND PELVIS WITH CONTRAST TECHNIQUE: Multidetector CT imaging of the abdomen and pelvis was performed using the standard protocol following bolus administration of intravenous contrast. RADIATION DOSE REDUCTION: This exam was performed according to the departmental dose-optimization program which includes automated exposure control, adjustment of the mA and/or kV according to patient size and/or use of iterative reconstruction technique. CONTRAST:  OMNIPAQUE IOHEXOL 300 MG/ML  SOLN COMPARISON:  CT abdomen pelvis 08/29/2022 FINDINGS: Lower chest: No acute abnormality. Hepatobiliary: No focal liver abnormality is seen. No gallstones, gallbladder wall thickening, or biliary dilatation. Pancreas: Unremarkable. No pancreatic ductal dilatation or surrounding inflammatory changes. Spleen: Normal in size without focal abnormality. Adrenals/Urinary Tract: Adrenal glands are unremarkable. Kidneys are normal,  without renal calculi, focal lesion, or hydronephrosis. Bladder is unremarkable. Stomach/Bowel: Stomach is within normal limits. Appendix appears normal. There are multiple colonic diverticula. There is focal bowel wall thickening and pericolonic fat stranding in the left lower quadrant descending colon. There is an inflamed diverticulum. No evidence of abscess. There is 1 extraluminal focus of gas which could represent a tiny contained perforation. No distant free air. No evidence of obstruction. Vascular/Lymphatic: No significant vascular findings are present. No enlarged abdominal or pelvic lymph nodes. Reproductive: Prostate is unremarkable. Other: No abdominal wall hernia or abnormality. No abdominopelvic ascites.  Musculoskeletal: No acute or significant osseous findings. IMPRESSION: Acute diverticulitis of the left lower quadrant descending colon. No evidence of abscess. There is 1 extraluminal focus of gas which could represent a tiny contained perforation. No distant free air. Electronically Signed   By: Audie Pinto M.D.   On: 09/11/2022 11:39    Pending Labs Unresulted Labs (From admission, onward)    None       Vitals/Pain Today's Vitals   09/11/22 1350 09/11/22 1400 09/11/22 1413 09/11/22 1426  BP:  103/89    Pulse:  99    Resp:  18    Temp: 98.9 F (37.2 C)     TempSrc: Oral     SpO2:  96%    Weight:      Height:      PainSc:   5  7     Isolation Precautions No active isolations  Medications Medications  0.9 %  sodium chloride infusion (0 mLs Intravenous Stopped 09/11/22 1352)  0.9 % NaCl with KCl 20 mEq/ L  infusion ( Intravenous New Bag/Given 09/11/22 1350)  morphine (PF) 4 MG/ML injection 4 mg (4 mg Intravenous Given 09/11/22 1428)  ondansetron (ZOFRAN) injection 4 mg (has no administration in time range)  piperacillin-tazobactam (ZOSYN) IVPB 3.375 g (has no administration in time range)  sodium chloride 0.9 % bolus 1,000 mL ( Intravenous Stopped 09/11/22 1143)   ondansetron (ZOFRAN) injection 4 mg (4 mg Intravenous Given 09/11/22 1029)  morphine (PF) 4 MG/ML injection 4 mg (4 mg Intravenous Given 09/11/22 1030)  iohexol (OMNIPAQUE) 300 MG/ML solution 100 mL (100 mLs Intravenous Contrast Given 09/11/22 1118)  piperacillin-tazobactam (ZOSYN) IVPB 3.375 g (0 g Intravenous Stopped 09/11/22 1350)    Mobility walks     Focused Assessments Hx of diverticulitis , Hx of pulmonary emobi, Hx of Kidney stones, LLQ abdominal pain with diarrhea x 1 week.     R Recommendations: See Admitting Provider Note  Report given to:   Additional Notes:  Diverticulitis with possible perforation.

## 2022-09-11 NOTE — H&P (Addendum)
History and Physical    Patient: Douglas Newman ZOX:[id number] DOB: Jun 18, 1991 DOA: 09/11/2022 DOS: the patient was seen and examined on 09/11/2022 PCP: [facility]  Patient coming from: Home  Chief Complaint:  Chief Complaint  Patient presents with   Abdominal Pain   HPI: Douglas Newman is a 31 y.o. male with medical history significant for but not limited to history of diverticulitis back last year, history of PE no longer taking any anticoagulation who presents with abdominal pain for about a week with some associated loose stools and for some pain has been getting worse.  He has had some weight loss and had nausea but no vomiting.  He was recently here about 2 weeks ago for kidney stone on left though he passed it 2 days after and been pain-free.  He has had diverticulitis in the past and he states that this pain feels similar to that and the pain is an 8 out of 10 in severity and is intermittent.  Because of his abdominal pain that worsened he presented to the ED for further evaluation and he was worked up further with a CT scan abdomen pelvis which showed acute diverticulitis with possible microperforation.  General surgery has been consulted and patient was transferred from med center to [facility] for further management and evaluation   Review of Systems: As mentioned in the history of present illness. All other systems reviewed and are negative. Past Medical History:  Diagnosis Date   Diverticulitis    Pulmonary embolism ([place])    SURGICAL HISTORY History reviewed. No pertinent surgical history. Social History:  reports that he has quit smoking. He has quit using smokeless tobacco. He reports that he does not drink alcohol and does not use drugs.  Allergies  Allergen Reactions   Pollen Extract Itching and Other (See Comments)    Itchy eyes, runny nose, congestion   Family History Reviewed Not pertinent to the presenting complaint and No family history on  file.  Prior to Admission medications   Medication Sig Start Date End Date Taking? Authorizing Provider  IMODIUM A-D 2 MG tablet Take 2 mg by mouth 4 (four) times daily as needed for diarrhea or loose stools.   Yes [provider]  TYLENOL 500 MG tablet Take 500-1,000 mg by mouth every 6 (six) hours as needed for mild pain or headache.   Yes [provider]  amoxicillin-clavulanate (AUGMENTIN) 875-125 MG tablet Take 1 tablet by mouth every 12 (twelve) hours. Patient not taking: Reported on 09/11/2022 10/10/21   Sherrell Puller, PA-C  ondansetron (ZOFRAN-ODT) 4 MG disintegrating tablet Take 1 tablet (4 mg total) by mouth every 8 (eight) hours as needed for nausea or vomiting. Patient not taking: Reported on 09/11/2022 08/29/22   Davonna Belling, MD  oxyCODONE-acetaminophen (PERCOCET/ROXICET) 5-325 MG tablet Take 1-2 tablets by mouth every 8 (eight) hours as needed for severe pain. Patient not taking: Reported on 09/11/2022 08/29/22   Davonna Belling, MD  Rivaroxaban 15 & 20 MG TBPK Take as directed on package: Start with one 15mg  tablet by mouth twice a day with food. On Day 22, switch to one 20mg  tablet once a day with food. Patient not taking: Reported on 09/11/2022 01/17/19   Nettie Elm, PA-C    Physical Exam: Vitals:   09/11/22 1500 09/11/22 1631 09/11/22 1729 09/11/22 1822  BP: 111/72 (!) 139/90 (!) 159/92 134/85  Pulse: (!) 102 98 98 (!) 106  Resp: 18 17 18 16   Temp:  99.7  F (37.6 C) 100 F (37.8 C) 99.8 F (37.7 C)  TempSrc:  Oral Oral Oral  SpO2: 96% 96% 97% 97%  Weight:      Height:       Examination: Physical Exam:  Constitutional: WN/WD obese Caucasian male currently no acute distress appears calm Respiratory: Diminished to auscultation bilaterally, no wheezing, rales, rhonchi or crackles. Normal respiratory effort and patient is not tachypenic. No accessory muscle use.  Unlabored breathing Cardiovascular: RRR, no murmurs / rubs / gallops. S1 and S2  auscultated.  No appreciable extremity edema Abdomen: Soft, tender to palpate on the left side, stented secondary to body habitus. No appreciable hepatosplenomegaly. Bowel sounds positive.  GU: Deferred. Musculoskeletal: No clubbing / cyanosis of digits/nails. No joint deformity upper and lower extremities. Skin: No rashes, lesions, ulcers on limited skin evaluation. No induration; Warm and dry.  Neurologic: CN 2-12 grossly intact with no focal deficits. Romberg sign and cerebellar reflexes not assessed.  Psychiatric: Normal judgment and insight. Alert and oriented x 3. Normal mood and appropriate affect.   Data Reviewed:  Data was interpreted and he does have some abnormal LFTs with an AST of 49 and ALT of 76, calcium level 8.7, and a WBC level of 13.2 with his CT scan reviewed and a urinalysis done which showed a clear appearance  Assessment and Plan: No notes have been filed under this hospital service. Service: Hospitalist  Acute Recurrent Diverticulitis -Presents with abdominal pain but no nausea or vomiting and says abdominal pain is in the left side -States this is his second diverticulitis flare and he has had 1 about a year ago -CT scan of the abdomen pelvis done and showed "Acute diverticulitis of the left lower quadrant descending colon. No evidence of abscess. There is 1 extraluminal focus of gas which could represent a tiny contained perforation. No distant free air." -Continue with IV antibiotics with IV Zosyn -Liquid diet and advance diet as tolerated -Continue with supportive care with antiemetics emetics with ondansetron 4 mg p.o./IV every 6 as needed for nausea -Continue with IV fluid hydration with IV normal saline +20 mEq of KCl at 125 MLS per hour -Continue with acetaminophen 650 mg p.o./RC every 6 as needed for mild pain, 5 mg oxycodone p.o. every 4 as needed for moderate pain and 4 mg of IV morphine every 3 hours as needed severe pain -Continue with bowel regimen  with MiraLAX 17 g p.o. daily -Received 1 L normal saline bolus in the ED -General surgery consulted and I spoke with Dr. Almond Lint who states that the patient will be seen in the morning -WBC went from 10.7 on last check on 08/29/2022 is now 13.2 -Continue with supportive care  History of PE -No longer taking anticoagulation -Continue to monitor and placed on enoxaparin subcu  Abnormal LFTs -Mild and AST is now 49 and ALT is now 76 -Avoid hepatotoxic medications and if necessary will obtain a right upper quadrant ultrasound and acute hepatitis panel-continue monitor and trend hepatic function panel carefully and repeat CMP in the a.m.  Leukocytosis -In the setting of above -Patient's WBC is now 13.2 -Continue monitor and trend and repeat CMP in a.m.  Morbid Obesity -Complicates overall prognosis and care -Estimated body mass index is 40.69 kg/m as calculated from the following:   Height as of this encounter: 6' (1.829 m).   Weight as of this encounter: 136.1 kg.  -Weight Loss and Dietary Counseling given   Advance Care Planning:   Code Status:  Full Code FULL CODE   Consults: General surgery  Family Communication: I spoke with family at bedside  Severity of Illness: The appropriate patient status for this patient is INPATIENT. Inpatient status is judged to be reasonable and necessary in order to provide the required intensity of service to ensure the patient's safety. The patient's presenting symptoms, physical exam findings, and initial radiographic and laboratory data in the context of their chronic comorbidities is felt to place them at high risk for further clinical deterioration. Furthermore, it is not anticipated that the patient will be medically stable for discharge from the hospital within 2 midnights of admission.   * I certify that at the point of admission it is my clinical judgment that the patient will require inpatient hospital care spanning beyond 2 midnights from  the point of admission due to high intensity of service, high risk for further deterioration and high frequency of surveillance required.*  Author: Marguerita Merles, DO 09/11/2022 6:46 PM  For on call review [email or web address].

## 2022-09-11 NOTE — ED Notes (Signed)
Called report to [place] with [employer]. Spoke with Dawson Bills, RN. [facility] to transport

## 2022-09-11 NOTE — ED Notes (Signed)
[facility] at bedside for report. 

## 2022-09-11 NOTE — ED Triage Notes (Signed)
C/O abdominal pain in the LLQ region for almost a week along w/ mucousy  foul smelling stool; reported hx of diverticulitis and symptoms feels similar.

## 2022-09-11 NOTE — ED Provider Notes (Signed)
[facility] EMERGENCY DEPARTMENT Provider Note   CSN: [id number] Arrival date & time: 09/11/22  2542     History  Chief Complaint  Patient presents with   Abdominal Pain    Douglas Newman is a 31 y.o. male.  He is complaining of about a week of some loose stools and left lower quadrant abdominal pain its been getting progressively worse.  Associated with some anorexia.  Nausea no vomiting.  No blood in his stool that he is aware of.  No clear fever.  He was here about 2 weeks ago for a kidney stone on the left although he thinks he passed it 2 days after being seen and was pain-free after that.  He has had diverticulitis in the past and he says this feels like that.  The pain is 8 out of 10 comes in waves.  The history is provided by the patient.  Abdominal Pain Pain location:  LLQ Pain quality: stabbing   Pain radiates to:  Does not radiate Pain severity:  Severe Onset quality:  Gradual Duration:  4 days Timing:  Intermittent Progression:  Worsening Chronicity:  Recurrent Context: not recent travel, not sick contacts, not suspicious food intake and not trauma   Relieved by:  Nothing Worsened by:  Nothing Ineffective treatments:  None tried Associated symptoms: anorexia, diarrhea and nausea   Associated symptoms: no chest pain, no constipation, no cough, no dysuria, no fever, no hematemesis, no hematochezia, no hematuria, no shortness of breath, no sore throat and no vomiting        Home Medications Prior to Admission medications   Medication Sig Start Date End Date Taking? Authorizing Provider  amoxicillin-clavulanate (AUGMENTIN) 875-125 MG tablet Take 1 tablet by mouth every 12 (twelve) hours. 10/10/21   Achille Rich, PA-C  ondansetron (ZOFRAN-ODT) 4 MG disintegrating tablet Take 1 tablet (4 mg total) by mouth every 8 (eight) hours as needed for nausea or vomiting. 08/29/22   Benjiman Core, MD  oxyCODONE-acetaminophen (PERCOCET/ROXICET) 5-325 MG tablet Take 1-2  tablets by mouth every 8 (eight) hours as needed for severe pain. 08/29/22   Benjiman Core, MD  Rivaroxaban 15 & 20 MG TBPK Take as directed on package: Start with one 15mg  tablet by mouth twice a day with food. On Day 22, switch to one 20mg  tablet once a day with food. 01/17/19   Henderly, Britni A, PA-C      Allergies    Patient has no known allergies.    Review of Systems   Review of Systems  Constitutional:  Negative for fever.  HENT:  Negative for sore throat.   Respiratory:  Negative for cough and shortness of breath.   Cardiovascular:  Negative for chest pain.  Gastrointestinal:  Positive for abdominal pain, anorexia, diarrhea and nausea. Negative for constipation, hematemesis, hematochezia and vomiting.  Genitourinary:  Negative for dysuria and hematuria.  Musculoskeletal:  Negative for back pain.  Skin:  Negative for rash.    Physical Exam Updated Vital Signs BP 102/78 (BP Location: Left Arm)   Pulse (!) 113   Temp 99.2 F (37.3 C) (Oral)   Resp 18   Ht 6' (1.829 m)   Wt 136.1 kg   SpO2 96%   BMI 40.69 kg/m  Physical Exam Vitals and nursing note reviewed.  Constitutional:      General: He is not in acute distress.    Appearance: Normal appearance. He is well-developed.  HENT:     Head: Normocephalic and atraumatic.  Eyes:  Conjunctiva/sclera: Conjunctivae normal.  Cardiovascular:     Rate and Rhythm: Normal rate and regular rhythm.     Heart sounds: No murmur heard. Pulmonary:     Effort: Pulmonary effort is normal. No respiratory distress.     Breath sounds: Normal breath sounds.  Abdominal:     Palpations: Abdomen is soft.     Tenderness: There is abdominal tenderness in the left lower quadrant. There is no guarding or rebound.  Musculoskeletal:        General: No deformity.     Cervical back: Neck supple.  Skin:    General: Skin is warm and dry.     Capillary Refill: Capillary refill takes less than 2 seconds.  Neurological:     General: No  focal deficit present.     Mental Status: He is alert.     ED Results / Procedures / Treatments   Labs (all labs ordered are listed, but only abnormal results are displayed) Labs Reviewed  COMPREHENSIVE METABOLIC PANEL - Abnormal; Notable for the following components:      Result Value   Calcium 8.7 (*)    AST 49 (*)    ALT 76 (*)    All other components within normal limits  CBC - Abnormal; Notable for the following components:   WBC 13.2 (*)    All other components within normal limits  LIPASE, BLOOD  URINALYSIS, ROUTINE W REFLEX MICROSCOPIC  MAGNESIUM  PHOSPHORUS    EKG None  Radiology CT Abdomen Pelvis W Contrast  Result Date: 09/11/2022 CLINICAL DATA:  Left lower quadrant abdominal pain. EXAM: CT ABDOMEN AND PELVIS WITH CONTRAST TECHNIQUE: Multidetector CT imaging of the abdomen and pelvis was performed using the standard protocol following bolus administration of intravenous contrast. RADIATION DOSE REDUCTION: This exam was performed according to the departmental dose-optimization program which includes automated exposure control, adjustment of the mA and/or kV according to patient size and/or use of iterative reconstruction technique. CONTRAST:  180mL OMNIPAQUE IOHEXOL 300 MG/ML  SOLN COMPARISON:  CT abdomen pelvis 08/29/2022 FINDINGS: Lower chest: No acute abnormality. Hepatobiliary: No focal liver abnormality is seen. No gallstones, gallbladder wall thickening, or biliary dilatation. Pancreas: Unremarkable. No pancreatic ductal dilatation or surrounding inflammatory changes. Spleen: Normal in size without focal abnormality. Adrenals/Urinary Tract: Adrenal glands are unremarkable. Kidneys are normal, without renal calculi, focal lesion, or hydronephrosis. Bladder is unremarkable. Stomach/Bowel: Stomach is within normal limits. Appendix appears normal. There are multiple colonic diverticula. There is focal bowel wall thickening and pericolonic fat stranding in the left lower  quadrant descending colon. There is an inflamed diverticulum. No evidence of abscess. There is 1 extraluminal focus of gas which could represent a tiny contained perforation. No distant free air. No evidence of obstruction. Vascular/Lymphatic: No significant vascular findings are present. No enlarged abdominal or pelvic lymph nodes. Reproductive: Prostate is unremarkable. Other: No abdominal wall hernia or abnormality. No abdominopelvic ascites. Musculoskeletal: No acute or significant osseous findings. IMPRESSION: Acute diverticulitis of the left lower quadrant descending colon. No evidence of abscess. There is 1 extraluminal focus of gas which could represent a tiny contained perforation. No distant free air. Electronically Signed   By: Audie Pinto M.D.   On: 09/11/2022 11:39    Procedures Procedures    Medications Ordered in ED Medications  0.9 %  sodium chloride infusion (0 mLs Intravenous Stopped 09/11/22 1352)  0.9 % NaCl with KCl 20 mEq/ L  infusion ( Intravenous New Bag/Given 09/11/22 1350)  morphine (PF) 4 MG/ML injection  4 mg (4 mg Intravenous Given 09/11/22 1428)  ondansetron (ZOFRAN) injection 4 mg (has no administration in time range)  piperacillin-tazobactam (ZOSYN) IVPB 3.375 g (has no administration in time range)  sodium chloride 0.9 % bolus 1,000 mL ( Intravenous Stopped 09/11/22 1143)  ondansetron (ZOFRAN) injection 4 mg (4 mg Intravenous Given 09/11/22 1029)  morphine (PF) 4 MG/ML injection 4 mg (4 mg Intravenous Given 09/11/22 1030)  iohexol (OMNIPAQUE) 300 MG/ML solution 100 mL (100 mLs Intravenous Contrast Given 09/11/22 1118)  piperacillin-tazobactam (ZOSYN) IVPB 3.375 g (0 g Intravenous Stopped 09/11/22 1350)    ED Course/ Medical Decision Making/ A&P Clinical Course as of 09/11/22 1721  Mon Sep 11, 2022  1228 Discussed with Dr. Carolynne Edouard from general surgery.  He felt the patient would be better served by being admitted to the hospital on medicine service on IV antibiotics  until his white count normalizes.  Patient agreeable to plan. [MB]  1312 Discussed with Dr. Robb Matar [facility] hospitalist who will put the patient in for a bed. [MB]    Clinical Course User Index [MB] Terrilee Files, MD                           Medical Decision Making Amount and/or Complexity of Data Reviewed Labs: ordered. Radiology: ordered.  Risk Prescription drug management. Decision regarding hospitalization.   This patient complains of left lower quadrant abdominal pain stools; this involves an extensive number of treatment Options and is a complaint that carries with it a high risk of complications and morbidity. The differential includes diverticulitis, colitis, perforation, renal colic  I ordered, reviewed and interpreted labs, which included CBC with elevated white count normal hemoglobin, chemistries normal, LFTs mildly elevated, urinalysis without signs of infection I ordered medication IV fluids pain medication IV antibiotics and reviewed PMP when indicated. I ordered imaging studies which included CT abdomen and pelvis and I independently    visualized and interpreted imaging which showed acute diverticulitis with possible microperforation Previous records obtained and reviewed in epic including recent ED visit for renal colic I consulted Dr. Carolynne Edouard for general surgery and Dr. Robb Matar [facility] hospitalist and discussed lab and imaging findings and discussed disposition.  Cardiac monitoring reviewed, normal sinus rhythm Social determinants considered, no significant barriers Critical Interventions: None  After the interventions stated above, I reevaluated the patient and found patient's pain to be improved Admission and further testing considered, he would benefit from mission to the hospital for IV antibiotics and serial evaluation due to perforation of diverticulitis.  Patient in agreement with plan.         Final Clinical Impression(s) / ED Diagnoses Final diagnoses:   Acute diverticulitis    Rx / DC Orders ED Discharge Orders     None         Terrilee Files, MD 09/11/22 1723

## 2022-09-11 NOTE — Consult Note (Signed)
Reason for Consult:sigmoid diverticulitis Referring Physician: [name] is an 31 y.o. male.  HPI:  Pt presents with around 1 week of progressive abdominal pain and loose stools.  He has a history of diverticulitis and it felt similar to previous episode.  He recently passed a kidney stone.  He denies fever/chills/nausea/vomiting.  *** ? colonoscopy  He has h/o PE, and is on ***. ***? Prior workup for VTE  Past Medical History:  Diagnosis Date   Diverticulitis    Pulmonary embolism (HCC)     History reviewed. No pertinent surgical history.  No family history on file.  Social History:  reports that he has quit smoking. He has quit using smokeless tobacco. He reports that he does not drink alcohol and does not use drugs.  Allergies:  Allergies  Allergen Reactions   Pollen Extract Itching and Other (See Comments)    Itchy eyes, runny nose, congestion    Medications:  IMODIUM A-D 2 MG tablet TYLENOL 500 MG tablet amoxicillin-clavulanate (AUGMENTIN) 875-125 MG tablet ondansetron (ZOFRAN-ODT) 4 MG disintegrating tablet oxyCODONE-acetaminophen (PERCOCET/ROXICET) 5-325 MG tablet Rivaroxaban 15 & 20 MG TBPK  Results for orders placed or performed during the hospital encounter of 09/11/22 (from the past 48 hour(s))  Lipase, blood     Status: None   Collection Time: 09/11/22 10:24 AM  Result Value Ref Range   Lipase 35 11 - 51 U/L    Comment: Performed at [facility], [address] [place], [place] [address]  Comprehensive metabolic panel     Status: Abnormal   Collection Time: 09/11/22 10:24 AM  Result Value Ref Range   Sodium 139 135 - 145 mmol/L   Potassium 3.6 3.5 - 5.1 mmol/L   Chloride 106 98 - 111 mmol/L   CO2 27 22 - 32 mmol/L   Glucose, Bld 98 70 - 99 mg/dL    Comment: Glucose reference range applies only to samples taken after fasting for at least 8 hours.   BUN 12 6 - 20 mg/dL   Creatinine, Ser 5.05 0.61 - 1.24 mg/dL   Calcium 8.7 (L) 8.9 -  10.3 mg/dL   Total Protein 7.5 6.5 - 8.1 g/dL   Albumin 3.6 3.5 - 5.0 g/dL   AST 49 (H) 15 - 41 U/L   ALT 76 (H) 0 - 44 U/L   Alkaline Phosphatase 64 38 - 126 U/L   Total Bilirubin 0.5 0.3 - 1.2 mg/dL   GFR, Estimated >39 >76 mL/min    Comment: (NOTE) Calculated using the CKD-EPI Creatinine Equation (2021)    Anion gap 6 5 - 15    Comment: Performed at [facility], [address] [place], [place] [address]  CBC     Status: Abnormal   Collection Time: 09/11/22 10:24 AM  Result Value Ref Range   WBC 13.2 (H) 4.0 - 10.5 K/uL   RBC 4.71 4.22 - 5.81 MIL/uL   Hemoglobin 14.4 13.0 - 17.0 g/dL   HCT 37.9 02.4 - 09.7 %   MCV 89.0 80.0 - 100.0 fL   MCH 30.6 26.0 - 34.0 pg   MCHC 34.4 30.0 - 36.0 g/dL   RDW 35.3 29.9 - 24.2 %   Platelets 363 150 - 400 K/uL   nRBC 0.0 0.0 - 0.2 %    Comment: Performed at [facility], [address] [place], [place] [address]  Urinalysis, Routine w reflex microscopic Urine, Clean Catch  Status: None   Collection Time: 09/11/22 10:24 AM  Result Value Ref Range   Color, Urine YELLOW YELLOW   APPearance CLEAR CLEAR   Specific Gravity, Urine >=1.030 1.005 - 1.030   pH 5.5 5.0 - 8.0   Glucose, UA NEGATIVE NEGATIVE mg/dL   Hgb urine dipstick NEGATIVE NEGATIVE   Bilirubin Urine NEGATIVE NEGATIVE   Ketones, ur NEGATIVE NEGATIVE mg/dL   Protein, ur NEGATIVE NEGATIVE mg/dL   Nitrite NEGATIVE NEGATIVE   Leukocytes,Ua NEGATIVE NEGATIVE    Comment: Microscopic not done on urines with negative protein, blood, leukocytes, nitrite, or glucose < 500 mg/dL. Performed at [facility], [address], [place], [place] [address]   Magnesium     Status: None   Collection Time: 09/11/22 10:24 AM  Result Value Ref Range   Magnesium 2.0 1.7 - 2.4 mg/dL    Comment: Performed at [facility], [address] [place], [place] [address]  Phosphorus     Status: None   Collection Time: 09/11/22 10:24 AM  Result Value Ref  Range   Phosphorus 3.9 2.5 - 4.6 mg/dL    Comment: Performed at [facility], [address] [place], [place] [address]  TSH     Status: None   Collection Time: 09/11/22  6:47 PM  Result Value Ref Range   TSH 2.235 0.350 - 4.500 uIU/mL    Comment: Performed by a 3rd Generation assay with a functional sensitivity of <=0.01 uIU/mL. Performed at [facility], [address], [place], [place] [address]     CT Abdomen Pelvis W Contrast  Result Date: 09/11/2022 CLINICAL DATA:  Left lower quadrant abdominal pain. EXAM: CT ABDOMEN AND PELVIS WITH CONTRAST TECHNIQUE: Multidetector CT imaging of the abdomen and pelvis was performed using the standard protocol following bolus administration of intravenous contrast. RADIATION DOSE REDUCTION: This exam was performed according to the departmental dose-optimization program which includes automated exposure control, adjustment of the mA and/or kV according to patient size and/or use of iterative reconstruction technique. CONTRAST:  OMNIPAQUE IOHEXOL 300 MG/ML  SOLN COMPARISON:  CT abdomen pelvis 08/29/2022 FINDINGS: Lower chest: No acute abnormality. Hepatobiliary: No focal liver abnormality is seen. No gallstones, gallbladder wall thickening, or biliary dilatation. Pancreas: Unremarkable. No pancreatic ductal dilatation or surrounding inflammatory changes. Spleen: Normal in size without focal abnormality. Adrenals/Urinary Tract: Adrenal glands are unremarkable. Kidneys are normal, without renal calculi, focal lesion, or hydronephrosis. Bladder is unremarkable. Stomach/Bowel: Stomach is within normal limits. Appendix appears normal. There are multiple colonic diverticula. There is focal bowel wall thickening and pericolonic fat stranding in the left lower quadrant descending colon. There is an inflamed diverticulum. No evidence of abscess. There is 1 extraluminal focus of gas which could represent a tiny contained perforation. No  distant free air. No evidence of obstruction. Vascular/Lymphatic: No significant vascular findings are present. No enlarged abdominal or pelvic lymph nodes. Reproductive: Prostate is unremarkable. Other: No abdominal wall hernia or abnormality. No abdominopelvic ascites. Musculoskeletal: No acute or significant osseous findings. IMPRESSION: Acute diverticulitis of the left lower quadrant descending colon. No evidence of abscess. There is 1 extraluminal focus of gas which could represent a tiny contained perforation. No distant free air. Electronically Signed   By: Emmaline Kluver M.D.   On: 09/11/2022 11:39    Review of Systems ***  Blood pressure 121/80, pulse (!) 106, temperature 98.5 F (36.9 C), temperature source Oral, resp. rate 18, height 6' (1.829 m),  weight 136.1 kg, SpO2 (!) 89 %. Physical Exam ***  Assessment/Plan: Acute sigmoid diverticulitis with possible microperforation H/o VTE  Bowel rest IV antibiotics Iv fluids  Hopefully he will improve on non operative therapy.  If so, he will need colonoscopy as an outpatient in 6-8 weeks.   Surgery to follow.    Stark Klein 09/11/2022, 10:30 PM

## 2022-09-12 ENCOUNTER — Encounter (HOSPITAL_COMMUNITY): Payer: Self-pay

## 2022-09-12 DIAGNOSIS — Z86711 Personal history of pulmonary embolism: Secondary | ICD-10-CM

## 2022-09-12 DIAGNOSIS — R7989 Other specified abnormal findings of blood chemistry: Secondary | ICD-10-CM | POA: Diagnosis not present

## 2022-09-12 DIAGNOSIS — D72829 Elevated white blood cell count, unspecified: Secondary | ICD-10-CM

## 2022-09-12 DIAGNOSIS — K5792 Diverticulitis of intestine, part unspecified, without perforation or abscess without bleeding: Secondary | ICD-10-CM | POA: Diagnosis not present

## 2022-09-12 LAB — CBC WITH DIFFERENTIAL/PLATELET
Abs Immature Granulocytes: 0.03 10*3/uL (ref 0.00–0.07)
Basophils Absolute: 0 10*3/uL (ref 0.0–0.1)
Basophils Relative: 0 %
Eosinophils Absolute: 0.3 10*3/uL (ref 0.0–0.5)
Eosinophils Relative: 3 %
HCT: 39.4 % (ref 39.0–52.0)
Hemoglobin: 13.2 g/dL (ref 13.0–17.0)
Immature Granulocytes: 0 %
Lymphocytes Relative: 23 %
Lymphs Abs: 2.2 10*3/uL (ref 0.7–4.0)
MCH: 30.5 pg (ref 26.0–34.0)
MCHC: 33.5 g/dL (ref 30.0–36.0)
MCV: 91 fL (ref 80.0–100.0)
Monocytes Absolute: 0.9 10*3/uL (ref 0.1–1.0)
Monocytes Relative: 10 %
Neutro Abs: 5.8 10*3/uL (ref 1.7–7.7)
Neutrophils Relative %: 64 %
Platelets: 288 10*3/uL (ref 150–400)
RBC: 4.33 MIL/uL (ref 4.22–5.81)
RDW: 13.2 % (ref 11.5–15.5)
WBC: 9.2 10*3/uL (ref 4.0–10.5)
nRBC: 0 % (ref 0.0–0.2)

## 2022-09-12 LAB — COMPREHENSIVE METABOLIC PANEL
ALT: 65 U/L — ABNORMAL HIGH (ref 0–44)
AST: 36 U/L (ref 15–41)
Albumin: 3.2 g/dL — ABNORMAL LOW (ref 3.5–5.0)
Alkaline Phosphatase: 55 U/L (ref 38–126)
Anion gap: 5 (ref 5–15)
BUN: 10 mg/dL (ref 6–20)
CO2: 27 mmol/L (ref 22–32)
Calcium: 8.2 mg/dL — ABNORMAL LOW (ref 8.9–10.3)
Chloride: 106 mmol/L (ref 98–111)
Creatinine, Ser: 0.86 mg/dL (ref 0.61–1.24)
GFR, Estimated: >60 mL/min (ref >60)
Glucose, Bld: 102 mg/dL — ABNORMAL HIGH (ref 70–99)
Potassium: 4 mmol/L (ref 3.5–5.1)
Sodium: 138 mmol/L (ref 135–145)
Total Bilirubin: 0.6 mg/dL (ref 0.3–1.2)
Total Protein: 6.6 g/dL (ref 6.5–8.1)

## 2022-09-12 LAB — GLUCOSE, CAPILLARY: Glucose-Capillary: 95 mg/dL (ref 70–99)

## 2022-09-12 LAB — HIV ANTIBODY (ROUTINE TESTING W REFLEX): HIV Screen 4th Generation wRfx: NONREACTIVE

## 2022-09-12 LAB — PHOSPHORUS: Phosphorus: 4.3 mg/dL (ref 2.5–4.6)

## 2022-09-12 LAB — MAGNESIUM: Magnesium: 2.1 mg/dL (ref 1.7–2.4)

## 2022-09-12 NOTE — TOC Progression Note (Addendum)
Transition of Care [facility]) - Progression Note    Patient Details  Name: Kwabena Strutz MRN: [id number] Date of Birth: [birth year]  Transition of Care [facility]) CM/SW Contact  Servando Snare, [place] Phone Number: 09/12/2022, 10:12 AM  Clinical Narrative:     Transition of Care (TOC) Screening Note   Patient Details  Name: Santos Sollenberger Date of Birth: [birth year]   Transition of Care [facility]) CM/SW Contact:    Servando Snare, LCSW Phone Number: 09/12/2022, 10:12 AM    Transition of Care Department [facility]) has reviewed patient and no TOC needs have been identified at this time. We will continue to monitor patient advancement through interdisciplinary progression rounds. If new patient transition needs arise, please place a TOC consult.        Expected Discharge Plan and Services                                                 Social Determinants of Health (SDOH) Interventions Food Insecurity Interventions: Other (Comment) Housing Interventions: Patient Refused Transportation Interventions: Intervention Not Indicated Utilities Interventions: Patient Refused  Readmission Risk Interventions     No data to display

## 2022-09-12 NOTE — Progress Notes (Signed)
PROGRESS NOTE    Douglas Newman  GUY:[id number] DOB: [birth year] DOA: 09/11/2022 PCP: [facility]   Brief Narrative:  Douglas Newman is a 31 y.o. male with medical history significant for but not limited to history of diverticulitis back last year, history of PE no longer taking any anticoagulation who presents with abdominal pain for about a week with some associated loose stools and for some pain has been getting worse.  He has had some weight loss and had nausea but no vomiting.  He was recently here about 2 weeks ago for kidney stone on left though he passed it 2 days after and been pain-free.  He has had diverticulitis in the past and he states that this pain feels similar to that and the pain is an 8 out of 10 in severity and is intermittent.  Because of his abdominal pain that worsened he presented to the ED for further evaluation and he was worked up further with a CT scan abdomen pelvis which showed acute diverticulitis with possible microperforation.  General surgery has been consulted and patient was transferred from med center to [facility] for further management and evaluation.  He is to remain on clears today with bowel rest and hopefully improved with further medical management.  Surgery evaluated and recommending no surgical intervention at this time and states that if he does improve will recommend an outpatient colonoscopy in 6 to 8 weeks.   Assessment and Plan:  Acute Recurrent Diverticulitis -Presents with abdominal pain but no nausea or vomiting and says abdominal pain is in the left side -States this is his second diverticulitis flare and he has had 1 about a year ago -CT scan of the abdomen pelvis done and showed "Acute diverticulitis of the left lower quadrant descending colon. No evidence of abscess. There is 1 extraluminal focus of gas which could represent a tiny contained perforation. No distant free air." -Continue with IV antibiotics with IV  Zosyn -Liquid diet and advance diet as tolerated -Continue with supportive care with antiemetics emetics with ondansetron 4 mg p.o./IV every 6 as needed for nausea -Continue with IV fluid hydration with IV normal saline +20 mEq of KCl at 125 MLS per hour and will reduce the rate to 75 MLS per hour -Continue with acetaminophen 650 mg p.o./RC every 6 as needed for mild pain, 5 mg oxycodone p.o. every 4 as needed for moderate pain and 4 mg of IV morphine every 3 hours as needed severe pain -Continue with bowel regimen with MiraLAX 17 g p.o. daily -Received 1 L normal saline bolus in the ED -General surgery consulted and I spoke with Dr. Stark Klein who states that the patient will be seen in the morning; general surgery recommending bowel rest and okay for clears today and IV antibiotics as well as IV fluids and hopeful that he will improve with nonoperative therapy and if he continues to improve they are recommending colonoscopy in outpatient in 6 to 8 weeks as well as the possibility of surgery with ostomy if he does not improve -WBC went from 10.7 on last check on 08/29/2022 is now 13.2 yesterday and today is now improved to 9.2 -Continue with supportive care   History of PE -No longer taking anticoagulation -Continue to monitor and placed on enoxaparin subcu   Abnormal LFTs -Mild and AST is now 49 and ALT is now 76 on admission and repeat shows an AST of 36 and ALT of 65 today -Avoid hepatotoxic medications and if  necessary will obtain a right upper quadrant ultrasound and acute hepatitis panel-continue monitor and trend hepatic function panel carefully and repeat CMP in the a.m.  Hypoalbuminemia -The patient's albumin level went from 4.1 -> 3.6 -> 3.2 -Continue to monitor and trend and repeat CMP in the a.m.   Leukocytosis -In the setting of above -Patient's WBC is now 13.2 and trended down to 9.2 and improved -Continue monitor and trend and repeat CMP in a.m.   Morbid  Obesity -Complicates overall prognosis and care -Estimated body mass index is 40.69 kg/m as calculated from the following:   Height as of this encounter: 6' (1.829 m).   Weight as of this encounter: 136.1 kg.  -Weight Loss and Dietary Counseling given  DVT prophylaxis: SCDs Start: 09/11/22 1829    Code Status: Full Code Family Communication: No family at bedside  Disposition Plan:  Level of care: Med-Surg Status is: Inpatient Remains inpatient appropriate because: Needs further clinical improvement and clearance by surgery   Consultants:  General surgery  Procedures:  As above  Antimicrobials:  Anti-infectives (From admission, onward)    Start     Dose/Rate Route Frequency Ordered Stop   09/11/22 2030  piperacillin-tazobactam (ZOSYN) IVPB 3.375 g        3.375 g 12.5 mL/hr over 240 Minutes Intravenous Every 8 hours 09/11/22 1316     09/11/22 1230  piperacillin-tazobactam (ZOSYN) IVPB 3.375 g        3.375 g 100 mL/hr over 30 Minutes Intravenous  Once 09/11/22 1229 09/11/22 1350       Subjective: Seen and examined at bedside and thinks his abdominal pain is improving.  Denies chest pain or shortness breath.  Had some mild nausea but this is improved.  No other concerns or complaints at this time.  Objective: Vitals:   09/11/22 1926 09/11/22 2144 09/12/22 0144 09/12/22 0524  BP: 132/87 121/80 99/61 128/77  Pulse: (!) 107 (!) 106 80 83  Resp: 18 18 18 17   Temp: 99.9 F (37.7 C) 98.5 F (36.9 C) 98 F (36.7 C) 97.8 F (36.6 C)  TempSrc: Oral Oral Oral Oral  SpO2: 96% 96% 93% 95%  Weight:      Height:        Intake/Output Summary (Last 24 hours) at 09/12/2022 0817 Last data filed at 09/12/2022 0600 Gross per 24 hour  Intake 3673.65 ml  Output 525 ml  Net 3148.65 ml   Filed Weights   09/11/22 1004  Weight: 136.1 kg   Examination: Physical Exam:  Constitutional: WN/WD morbidly obese Caucasian male currently no acute distress Respiratory: Diminished to  auscultation bilaterally, no wheezing, rales, rhonchi or crackles. Normal respiratory effort and patient is not tachypenic. No accessory muscle use.  Cardiovascular: RRR, no murmurs / rubs / gallops. S1 and S2 auscultated. No extremity edema.  Abdomen: Soft, slightly-tender, distended secondary body habitus. Bowel sounds positive.  GU: Deferred. Musculoskeletal: No clubbing / cyanosis of digits/nails. No joint deformity upper and lower extremities Skin: No rashes, lesions, ulcers on limited skin evaluation. No induration; Warm and dry.  Neurologic: CN 2-12 grossly intact with no focal deficits.  Romberg sign and cerebellar reflexes not assessed.  Psychiatric: Normal judgment and insight. Alert and oriented x 3. Normal mood and appropriate affect.   Data Reviewed: I have personally reviewed following labs and imaging studies  CBC: Recent Labs  Lab 09/11/22 1024  WBC 13.2*  HGB 14.4  HCT 41.9  MCV 89.0  PLT 363   Basic Metabolic Panel:  Recent Labs  Lab 09/11/22 1024 09/12/22 0438  NA 139 138  K 3.6 4.0  CL 106 106  CO2 27 27  GLUCOSE 98 102*  BUN 12 10  CREATININE 0.95 0.86  CALCIUM 8.7* 8.2*  MG 2.0 2.1  PHOS 3.9 4.3   GFR: Estimated Creatinine Clearance: 174.5 mL/min (by C-G formula based on SCr of 0.86 mg/dL). Liver Function Tests: Recent Labs  Lab 09/11/22 1024 09/12/22 0438  AST 49* 36  ALT 76* 65*  ALKPHOS 64 55  BILITOT 0.5 0.6  PROT 7.5 6.6  ALBUMIN 3.6 3.2*   Recent Labs  Lab 09/11/22 1024  LIPASE 35   No results for input(s): "AMMONIA" in the last 168 hours. Coagulation Profile: No results for input(s): "INR", "PROTIME" in the last 168 hours. Cardiac Enzymes: No results for input(s): "CKTOTAL", "CKMB", "CKMBINDEX", "TROPONINI" in the last 168 hours. BNP (last 3 results) No results for input(s): "PROBNP" in the last 8760 hours. HbA1C: No results for input(s): "HGBA1C" in the last 72 hours. CBG: Recent Labs  Lab 09/12/22 0721  GLUCAP 95    Lipid Profile: No results for input(s): "CHOL", "HDL", "LDLCALC", "TRIG", "CHOLHDL", "LDLDIRECT" in the last 72 hours. Thyroid Function Tests: Recent Labs    09/11/22 1847  TSH 2.235   Anemia Panel: No results for input(s): "VITAMINB12", "FOLATE", "FERRITIN", "TIBC", "IRON", "RETICCTPCT" in the last 72 hours. Sepsis Labs: No results for input(s): "PROCALCITON", "LATICACIDVEN" in the last 168 hours.  No results found for this or any previous visit (from the past 240 hour(s)).   Radiology Studies: CT Abdomen Pelvis W Contrast  Result Date: 09/11/2022 CLINICAL DATA:  Left lower quadrant abdominal pain. EXAM: CT ABDOMEN AND PELVIS WITH CONTRAST TECHNIQUE: Multidetector CT imaging of the abdomen and pelvis was performed using the standard protocol following bolus administration of intravenous contrast. RADIATION DOSE REDUCTION: This exam was performed according to the departmental dose-optimization program which includes automated exposure control, adjustment of the mA and/or kV according to patient size and/or use of iterative reconstruction technique. CONTRAST:  OMNIPAQUE IOHEXOL 300 MG/ML  SOLN COMPARISON:  CT abdomen pelvis 08/29/2022 FINDINGS: Lower chest: No acute abnormality. Hepatobiliary: No focal liver abnormality is seen. No gallstones, gallbladder wall thickening, or biliary dilatation. Pancreas: Unremarkable. No pancreatic ductal dilatation or surrounding inflammatory changes. Spleen: Normal in size without focal abnormality. Adrenals/Urinary Tract: Adrenal glands are unremarkable. Kidneys are normal, without renal calculi, focal lesion, or hydronephrosis. Bladder is unremarkable. Stomach/Bowel: Stomach is within normal limits. Appendix appears normal. There are multiple colonic diverticula. There is focal bowel wall thickening and pericolonic fat stranding in the left lower quadrant descending colon. There is an inflamed diverticulum. No evidence of abscess. There is 1  extraluminal focus of gas which could represent a tiny contained perforation. No distant free air. No evidence of obstruction. Vascular/Lymphatic: No significant vascular findings are present. No enlarged abdominal or pelvic lymph nodes. Reproductive: Prostate is unremarkable. Other: No abdominal wall hernia or abnormality. No abdominopelvic ascites. Musculoskeletal: No acute or significant osseous findings. IMPRESSION: Acute diverticulitis of the left lower quadrant descending colon. No evidence of abscess. There is 1 extraluminal focus of gas which could represent a tiny contained perforation. No distant free air. Electronically Signed   By: Emmaline Kluver M.D.   On: 09/11/2022 11:39    Scheduled Meds:  enoxaparin (LOVENOX) injection  70 mg Subcutaneous Q24H   Continuous Infusions:  0.9 % NaCl with KCl 20 mEq / L 125 mL/hr at 09/12/22 0449  piperacillin-tazobactam 3.375 g (09/12/22 0533)    LOS: 1 day   Marguerita Merles, DO [facility] Available via Epic secure chat 7am-7pm After these hours, please refer to coverage provider listed on [email or web address] 09/12/2022, 8:17 AM

## 2022-09-12 NOTE — Plan of Care (Signed)
  Problem: Health Behavior/Discharge Planning: Goal: Ability to manage health-related needs will improve Outcome: Progressing   Problem: Clinical Measurements: Goal: Ability to maintain clinical measurements within normal limits will improve Outcome: Progressing Goal: Diagnostic test results will improve Outcome: Progressing Goal: Cardiovascular complication will be avoided Outcome: Progressing   Problem: Activity: Goal: Risk for activity intolerance will decrease Outcome: Progressing   Problem: Coping: Goal: Level of anxiety will decrease Outcome: Progressing   Problem: Elimination: Goal: Will not experience complications related to urinary retention Outcome: Progressing   Problem: Pain Managment: Goal: General experience of comfort will improve Outcome: Progressing   Problem: Safety: Goal: Ability to remain free from injury will improve Outcome: Progressing   Problem: Skin Integrity: Goal: Risk for impaired skin integrity will decrease Outcome: Progressing

## 2022-09-13 DIAGNOSIS — K5792 Diverticulitis of intestine, part unspecified, without perforation or abscess without bleeding: Secondary | ICD-10-CM | POA: Diagnosis not present

## 2022-09-13 DIAGNOSIS — R7989 Other specified abnormal findings of blood chemistry: Secondary | ICD-10-CM | POA: Diagnosis not present

## 2022-09-13 LAB — MAGNESIUM: Magnesium: 2.4 mg/dL (ref 1.7–2.4)

## 2022-09-13 LAB — COMPREHENSIVE METABOLIC PANEL
ALT: 51 U/L — ABNORMAL HIGH (ref 0–44)
AST: 28 U/L (ref 15–41)
Albumin: 3.3 g/dL — ABNORMAL LOW (ref 3.5–5.0)
Alkaline Phosphatase: 51 U/L (ref 38–126)
Anion gap: 5 (ref 5–15)
BUN: 6 mg/dL (ref 6–20)
CO2: 26 mmol/L (ref 22–32)
Calcium: 8.3 mg/dL — ABNORMAL LOW (ref 8.9–10.3)
Chloride: 107 mmol/L (ref 98–111)
Creatinine, Ser: 0.76 mg/dL (ref 0.61–1.24)
GFR, Estimated: >60 mL/min (ref >60)
Glucose, Bld: 92 mg/dL (ref 70–99)
Potassium: 4 mmol/L (ref 3.5–5.1)
Sodium: 138 mmol/L (ref 135–145)
Total Bilirubin: 0.7 mg/dL (ref 0.3–1.2)
Total Protein: 6.8 g/dL (ref 6.5–8.1)

## 2022-09-13 LAB — CBC WITH DIFFERENTIAL/PLATELET
Abs Immature Granulocytes: 0.04 10*3/uL (ref 0.00–0.07)
Basophils Absolute: 0 10*3/uL (ref 0.0–0.1)
Basophils Relative: 0 %
Eosinophils Absolute: 0.4 10*3/uL (ref 0.0–0.5)
Eosinophils Relative: 6 %
HCT: 39.7 % (ref 39.0–52.0)
Hemoglobin: 13.4 g/dL (ref 13.0–17.0)
Immature Granulocytes: 1 %
Lymphocytes Relative: 29 %
Lymphs Abs: 2 10*3/uL (ref 0.7–4.0)
MCH: 30.8 pg (ref 26.0–34.0)
MCHC: 33.8 g/dL (ref 30.0–36.0)
MCV: 91.3 fL (ref 80.0–100.0)
Monocytes Absolute: 0.5 10*3/uL (ref 0.1–1.0)
Monocytes Relative: 8 %
Neutro Abs: 3.9 10*3/uL (ref 1.7–7.7)
Neutrophils Relative %: 56 %
Platelets: 341 10*3/uL (ref 150–400)
RBC: 4.35 MIL/uL (ref 4.22–5.81)
RDW: 12.8 % (ref 11.5–15.5)
WBC: 6.8 10*3/uL (ref 4.0–10.5)
nRBC: 0 % (ref 0.0–0.2)

## 2022-09-13 LAB — GLUCOSE, CAPILLARY: Glucose-Capillary: 108 mg/dL — ABNORMAL HIGH (ref 70–99)

## 2022-09-13 LAB — PHOSPHORUS: Phosphorus: 4 mg/dL (ref 2.5–4.6)

## 2022-09-13 NOTE — Progress Notes (Signed)
  Progress Note   Patient: Douglas Newman UVO:[id number] DOB: 04/19/1991 DOA: 09/11/2022     2 DOS: the patient was seen and examined on 09/13/2022   Brief hospital course:  Assessment and Plan: Acute recurrent diverticulitis with possible microperforation --much improved, continue management per siurgery --likely home tomorrow if improved, will need outpt colonoscopy 6-8 weeks   Abnormal LFTs --etiology unclear but improving, follow-up as outpatient   Morbid Obesity --Body mass index is 41.53 kg/m. --recommend weight lostt  History of PE -No longer taking anticoagulation -Continue to monitor and placed on enoxaparin subcu      Subjective:  Feels much better Minimal LLQ pain Tolerating diet  Physical Exam: Vitals:   09/12/22 2040 09/13/22 0500 09/13/22 0542 09/13/22 1357  BP: 113/71  137/80 (!) 146/98  Pulse: (!) 102  72 75  Resp: 18  18 18   Temp: 98.2 F (36.8 C)  97.7 F (36.5 C) 98.1 F (36.7 C)  TempSrc: Oral  Oral Oral  SpO2: 92%  94% 97%  Weight:  (!) 138.9 kg    Height:       Physical Exam Vitals reviewed.  Constitutional:      General: He is not in acute distress.    Appearance: He is not ill-appearing or toxic-appearing.  Cardiovascular:     Rate and Rhythm: Normal rate and regular rhythm.     Heart sounds: No murmur heard. Pulmonary:     Effort: Pulmonary effort is normal. No respiratory distress.     Breath sounds: No wheezing, rhonchi or rales.  Abdominal:     Palpations: Abdomen is soft.     Tenderness: There is no abdominal tenderness.  Neurological:     Mental Status: He is alert.  Psychiatric:        Behavior: Behavior normal.     Data Reviewed:  CMP noted ALT down to 51 CBC WNL  Family Communication: none  Disposition: Status is: Inpatient Remains inpatient appropriate because: acute diverticulitis  Planned Discharge Destination: Home    Time spent: 25 minutes  Author: Murray Hodgkins, MD 09/13/2022 6:40 PM  For on  call review [email or web address].

## 2022-09-13 NOTE — Progress Notes (Signed)
[facility] Progress Note     Subjective: CC-  Feeling much better today. Mild LLQ soreness but pain is significantly improved since admission. Denies n/v. Passing a little flatus, no BM. WBC 6.8, VSS  Objective: Vital signs in last 24 hours: Temp:  [97.7 F (36.5 C)-98.2 F (36.8 C)] 97.7 F (36.5 C) (10/11 0542) Pulse Rate:  [72-102] 72 (10/11 0542) Resp:  [18] 18 (10/11 0542) BP: (113-158)/(71-88) 137/80 (10/11 0542) SpO2:  [92 %-96 %] 94 % (10/11 0542) Weight:  [138.9 kg] 138.9 kg (10/11 0500) Last BM Date : 09/10/22  Intake/Output from previous day: 10/10 0701 - 10/11 0700 In: 4418.7 [P.O.:2040; I.V.:2222.5; IV Piggyback:156.2] Out: 100 [Urine:100] Intake/Output this shift: No intake/output data recorded.  PE: Gen:  Alert, NAD, pleasant Abd: soft, ND, very mild subjective LLQ TTP without rebound or guarding  Lab Results:  Recent Labs    09/12/22 0841 09/13/22 0419  WBC 9.2 6.8  HGB 13.2 13.4  HCT 39.4 39.7  PLT 288 341   BMET Recent Labs    09/12/22 0438 09/13/22 0419  NA 138 138  K 4.0 4.0  CL 106 107  CO2 27 26  GLUCOSE 102* 92  BUN 10 6  CREATININE 0.86 0.76  CALCIUM 8.2* 8.3*   PT/INR No results for input(s): "LABPROT", "INR" in the last 72 hours. CMP     Component Value Date/Time   NA 138 09/13/2022 0419   K 4.0 09/13/2022 0419   CL 107 09/13/2022 0419   CO2 26 09/13/2022 0419   GLUCOSE 92 09/13/2022 0419   BUN 6 09/13/2022 0419   CREATININE 0.76 09/13/2022 0419   CALCIUM 8.3 (L) 09/13/2022 0419   PROT 6.8 09/13/2022 0419   ALBUMIN 3.3 (L) 09/13/2022 0419   AST 28 09/13/2022 0419   ALT 51 (H) 09/13/2022 0419   ALKPHOS 51 09/13/2022 0419   BILITOT 0.7 09/13/2022 0419   GFRNONAA >60 09/13/2022 0419   GFRAA >60 01/17/2019 1227   Lipase     Component Value Date/Time   LIPASE 35 09/11/2022 1024       Studies/Results: CT Abdomen Pelvis W Contrast  Result Date: 09/11/2022 CLINICAL DATA:  Left lower quadrant  abdominal pain. EXAM: CT ABDOMEN AND PELVIS WITH CONTRAST TECHNIQUE: Multidetector CT imaging of the abdomen and pelvis was performed using the standard protocol following bolus administration of intravenous contrast. RADIATION DOSE REDUCTION: This exam was performed according to the departmental dose-optimization program which includes automated exposure control, adjustment of the mA and/or kV according to patient size and/or use of iterative reconstruction technique. CONTRAST:  OMNIPAQUE IOHEXOL 300 MG/ML  SOLN COMPARISON:  CT abdomen pelvis 08/29/2022 FINDINGS: Lower chest: No acute abnormality. Hepatobiliary: No focal liver abnormality is seen. No gallstones, gallbladder wall thickening, or biliary dilatation. Pancreas: Unremarkable. No pancreatic ductal dilatation or surrounding inflammatory changes. Spleen: Normal in size without focal abnormality. Adrenals/Urinary Tract: Adrenal glands are unremarkable. Kidneys are normal, without renal calculi, focal lesion, or hydronephrosis. Bladder is unremarkable. Stomach/Bowel: Stomach is within normal limits. Appendix appears normal. There are multiple colonic diverticula. There is focal bowel wall thickening and pericolonic fat stranding in the left lower quadrant descending colon. There is an inflamed diverticulum. No evidence of abscess. There is 1 extraluminal focus of gas which could represent a tiny contained perforation. No distant free air. No evidence of obstruction. Vascular/Lymphatic: No significant vascular findings are present. No enlarged abdominal or pelvic lymph nodes. Reproductive: Prostate is unremarkable. Other: No abdominal wall hernia or  abnormality. No abdominopelvic ascites. Musculoskeletal: No acute or significant osseous findings. IMPRESSION: Acute diverticulitis of the left lower quadrant descending colon. No evidence of abscess. There is 1 extraluminal focus of gas which could represent a tiny contained perforation. No distant free air.  Electronically Signed   By: Audie Pinto M.D.   On: 09/11/2022 11:39    Anti-infectives: Anti-infectives (From admission, onward)    Start     Dose/Rate Route Frequency Ordered Stop   09/11/22 2030  piperacillin-tazobactam (ZOSYN) IVPB 3.375 g        3.375 g 12.5 mL/hr over 240 Minutes Intravenous Every 8 hours 09/11/22 1316     09/11/22 1230  piperacillin-tazobactam (ZOSYN) IVPB 3.375 g        3.375 g 100 mL/hr over 30 Minutes Intravenous  Once 09/11/22 1229 09/11/22 1350        Assessment/Plan Acute sigmoid diverticulitis with possible microperforation  - 2nd bout, never had a colonoscopy - CT 10/9 with acute diverticulitis of the left lower quadrant descending colon, no evidence of abscess, 1 extraluminal focus of gas which could represent a tiny contained perforation but no distant free air. - Symptoms improving, WBC WNL, VSS. Advance to full liquids. Continue IV antibiotics. If he continues to improve we may be able to advance to soft diet in the morning and discharge home later in the day on oral antibiotics. He is going to need outpatient GI follow up for colonoscopy in about 6-8 weeks.   ID - zosyn 10/9 >> FEN - IVF, FLD VTE - lovenox Foley - none  Nephrolithiasis H/o VTE no longer on anticoagulation Morbid obesity BMI 41.53 Abnormal LFTs  I reviewed hospitalist notes, last 24 h vitals and pain scores, last 48 h intake and output, last 24 h labs and trends, and last 24 h imaging results.    LOS: 2 days    [facility] 09/13/2022, 10:08 AM Please see Amion for pager number during day hours 7:00am-4:30pm

## 2022-09-14 DIAGNOSIS — K5792 Diverticulitis of intestine, part unspecified, without perforation or abscess without bleeding: Secondary | ICD-10-CM | POA: Diagnosis not present

## 2022-09-14 DIAGNOSIS — R7989 Other specified abnormal findings of blood chemistry: Secondary | ICD-10-CM

## 2022-09-14 LAB — GLUCOSE, CAPILLARY: Glucose-Capillary: 91 mg/dL (ref 70–99)

## 2022-09-14 MED ORDER — AMOXICILLIN-POT CLAVULANATE 875-125 MG PO TABS: 1.0000 | ORAL_TABLET | Freq: Two times a day (BID) | ORAL | 0 refills | Status: DC

## 2022-09-14 MED ORDER — AMOXICILLIN-POT CLAVULANATE 875-125 MG PO TABS: 1.0000 | ORAL_TABLET | Freq: Two times a day (BID) | ORAL | Status: DC

## 2022-09-14 NOTE — Discharge Summary (Signed)
Physician Discharge Summary   Patient: Douglas Newman MRN: [id number] DOB: 13-Nov-1991  Admit date:     09/11/2022  Discharge date: 09/14/22  Discharge Physician: Brendia Sacks   PCP: [facility]   Recommendations at discharge:   Colonoscopy 6-8 weeks and follow-up with general surgery thereafter for recurrent diverticulitis. Mild elevation of ALT, recommend repeat testing as an outpatient Recommend weight loss  Discharge Diagnoses: Principal Problem:   Acute diverticulitis Active Problems:   History of pulmonary embolism   Abnormal LFTs   Leukocytosis   Obesity, Class III, BMI 40-49.9 (morbid obesity) (HCC)  Resolved Problems:   * No resolved hospital problems. *  Hospital Course: 31 year old man PMH including diverticulitis, PE no longer on anticoagulation, presenting with abdominal pain and loose stools.  Admitted for acute diverticulitis with possible microperforation.  Seen by general surgery and managed conservatively with antibiotics.  Condition improved, tolerating diet and now cleared for discharge per general surgery.  Complete oral antibiotics as an outpatient.  Needs outpatient colonoscopy and outpatient follow-up.  Acute recurrent diverticulitis with possible microperforation --Much improved.  Tolerating diet.  Complete total 14 days antibiotics with Augmentin. --Outpt colonoscopy 6-8 weeks   Abnormal LFTs --etiology unclear but improving, follow-up as outpatient   Morbid Obesity --Body mass index is 41.53 kg/m. --recommend weight loss   History of PE -No longer taking anticoagulation -Continue to monitor and placed on enoxaparin subcu   Consultants:  General surgery  Procedures performed:  None   Disposition: Home Diet recommendation:  Soft x1 week, then advance DISCHARGE MEDICATION: Allergies as of 09/14/2022       Reactions   Pollen Extract Itching, Other (See Comments)   Itchy eyes, runny nose, congestion        Medication  List     STOP taking these medications    oxyCODONE-acetaminophen 5-325 MG tablet Commonly known as: PERCOCET/ROXICET   Rivaroxaban Stater Pack (15 mg and 20 mg) Commonly known as: XARELTO STARTER PACK   TYLENOL 500 MG tablet Generic drug: acetaminophen       TAKE these medications    amoxicillin-clavulanate 875-125 MG tablet Commonly known as: AUGMENTIN Take 1 tablet by mouth every 12 (twelve) hours. What changed: when to take this   Imodium A-D 2 MG tablet Generic drug: loperamide Take 2 mg by mouth 4 (four) times daily as needed for diarrhea or loose stools.   ondansetron 4 MG disintegrating tablet Commonly known as: ZOFRAN-ODT Take 1 tablet (4 mg total) by mouth every 8 (eight) hours as needed for nausea or vomiting.        Follow-up Information     [facility] Schedule an appointment as soon as possible for a visit in 2 week(s).   Contact information: [address] [facility] [place] [address] [phone]                Feels better No pain. Mild discomfort. Tolerating diet  Discharge Exam: Filed Weights   09/11/22 1004 09/13/22 0500 09/14/22 0500  Weight: 136.1 kg (!) 138.9 kg 136 kg   Physical Exam Vitals reviewed.  Constitutional:      General: He is not in acute distress.    Appearance: He is not ill-appearing or toxic-appearing.  Cardiovascular:     Rate and Rhythm: Normal rate and regular rhythm.     Heart sounds: No murmur heard. Pulmonary:     Effort: Pulmonary effort is normal. No respiratory distress.     Breath sounds: No wheezing, rhonchi or rales.  Neurological:     Mental Status: He is alert.  Psychiatric:        Mood and Affect: Mood normal.        Behavior: Behavior normal.   Condition at discharge: good  The results of significant diagnostics from this hospitalization (including imaging, microbiology, ancillary and laboratory) are listed below for reference.   Imaging Studies: CT  Abdomen Pelvis W Contrast  Result Date: 09/11/2022 CLINICAL DATA:  Left lower quadrant abdominal pain. EXAM: CT ABDOMEN AND PELVIS WITH CONTRAST TECHNIQUE: Multidetector CT imaging of the abdomen and pelvis was performed using the standard protocol following bolus administration of intravenous contrast. RADIATION DOSE REDUCTION: This exam was performed according to the departmental dose-optimization program which includes automated exposure control, adjustment of the mA and/or kV according to patient size and/or use of iterative reconstruction technique. CONTRAST:  18mL OMNIPAQUE IOHEXOL 300 MG/ML  SOLN COMPARISON:  CT abdomen pelvis 08/29/2022 FINDINGS: Lower chest: No acute abnormality. Hepatobiliary: No focal liver abnormality is seen. No gallstones, gallbladder wall thickening, or biliary dilatation. Pancreas: Unremarkable. No pancreatic ductal dilatation or surrounding inflammatory changes. Spleen: Normal in size without focal abnormality. Adrenals/Urinary Tract: Adrenal glands are unremarkable. Kidneys are normal, without renal calculi, focal lesion, or hydronephrosis. Bladder is unremarkable. Stomach/Bowel: Stomach is within normal limits. Appendix appears normal. There are multiple colonic diverticula. There is focal bowel wall thickening and pericolonic fat stranding in the left lower quadrant descending colon. There is an inflamed diverticulum. No evidence of abscess. There is 1 extraluminal focus of gas which could represent a tiny contained perforation. No distant free air. No evidence of obstruction. Vascular/Lymphatic: No significant vascular findings are present. No enlarged abdominal or pelvic lymph nodes. Reproductive: Prostate is unremarkable. Other: No abdominal wall hernia or abnormality. No abdominopelvic ascites. Musculoskeletal: No acute or significant osseous findings. IMPRESSION: Acute diverticulitis of the left lower quadrant descending colon. No evidence of abscess. There is 1  extraluminal focus of gas which could represent a tiny contained perforation. No distant free air. Electronically Signed   By: Audie Pinto M.D.   On: 09/11/2022 11:39   CT Renal Stone Study  Result Date: 08/29/2022 CLINICAL DATA:  Flank pain. EXAM: CT ABDOMEN AND PELVIS WITHOUT CONTRAST TECHNIQUE: Multidetector CT imaging of the abdomen and pelvis was performed following the standard protocol without IV contrast. RADIATION DOSE REDUCTION: This exam was performed according to the departmental dose-optimization program which includes automated exposure control, adjustment of the mA and/or kV according to patient size and/or use of iterative reconstruction technique. COMPARISON:  October 10, 2021. FINDINGS: Lower chest: No acute abnormality. Hepatobiliary: No focal liver abnormality is seen. No gallstones, gallbladder wall thickening, or biliary dilatation. Pancreas: Unremarkable. No pancreatic ductal dilatation or surrounding inflammatory changes. Spleen: Normal in size without focal abnormality. Adrenals/Urinary Tract: Adrenal glands appear normal. Right kidney and ureter are unremarkable. Minimal left hydroureteronephrosis is noted secondary to 3 mm calculus in the distal left ureter. The urinary bladder is decompressed. Stomach/Bowel: Stomach is within normal limits. Appendix appears normal. No evidence of bowel wall thickening, distention, or inflammatory changes. Vascular/Lymphatic: No significant vascular findings are present. No enlarged abdominal or pelvic lymph nodes. Reproductive: Prostate is unremarkable. Other: No abdominal wall hernia or abnormality. No abdominopelvic ascites. Musculoskeletal: No acute or significant osseous findings. IMPRESSION: Minimal left hydroureteronephrosis is noted secondary to 3 mm calculus in the distal left ureter. Electronically Signed   By: Marijo Conception M.D.   On: 08/29/2022 09:54    Microbiology: No results  found for this or any previous  visit.  Labs: CBC: Recent Labs  Lab 09/11/22 1024 09/12/22 0841 09/13/22 0419  WBC 13.2* 9.2 6.8  NEUTROABS  --  5.8 3.9  HGB 14.4 13.2 13.4  HCT 41.9 39.4 39.7  MCV 89.0 91.0 91.3  PLT 363 288 341   Basic Metabolic Panel: Recent Labs  Lab 09/11/22 1024 09/12/22 0438 09/13/22 0419  NA 139 138 138  K 3.6 4.0 4.0  CL 106 106 107  CO2 27 27 26   GLUCOSE 98 102* 92  BUN 12 10 6   CREATININE 0.95 0.86 0.76  CALCIUM 8.7* 8.2* 8.3*  MG 2.0 2.1 2.4  PHOS 3.9 4.3 4.0   Liver Function Tests: Recent Labs  Lab 09/11/22 1024 09/12/22 0438 09/13/22 0419  AST 49* 36 28  ALT 76* 65* 51*  ALKPHOS 64 55 51  BILITOT 0.5 0.6 0.7  PROT 7.5 6.6 6.8  ALBUMIN 3.6 3.2* 3.3*   CBG: Recent Labs  Lab 09/12/22 0721 09/13/22 0856 09/14/22 0721  GLUCAP 95 108* 91    Discharge time spent: greater than 30 minutes.  Signed: 11/13/22, MD [facility] 09/14/2022

## 2022-09-14 NOTE — Progress Notes (Signed)
Discharge instructions given to patient and all questions were answered.  

## 2022-09-14 NOTE — Progress Notes (Signed)
       Subjective: CC: Pain resolved. Tolerating fld without n/v. Passing flatus. No bm. 2nd episode of diverticulitis  No csc hx Wbc wnl. Afebrile.  Objective: Vital signs in last 24 hours: Temp:  [97.7 F (36.5 C)-98.9 F (37.2 C)] 97.7 F (36.5 C) (10/12 0600) Pulse Rate:  [75-84] 77 (10/12 0600) Resp:  [18] 18 (10/12 0600) BP: (111-146)/(74-98) 111/74 (10/12 0600) SpO2:  [94 %-97 %] 94 % (10/12 0600) Weight:  [136 kg] 136 kg (10/12 0500) Last BM Date : 09/10/22  Intake/Output from previous day: 10/11 0701 - 10/12 0700 In: 2418.4 [P.O.:2280; IV Piggyback:138.4] Out: 0  Intake/Output this shift: No intake/output data recorded.  PE: Gen:  Alert, NAD, pleasant Abd: Soft, ND, NT, +BS Psych: A&Ox3   Lab Results:  Recent Labs    09/12/22 0841 09/13/22 0419  WBC 9.2 6.8  HGB 13.2 13.4  HCT 39.4 39.7  PLT 288 341   BMET Recent Labs    09/12/22 0438 09/13/22 0419  NA 138 138  K 4.0 4.0  CL 106 107  CO2 27 26  GLUCOSE 102* 92  BUN 10 6  CREATININE 0.86 0.76  CALCIUM 8.2* 8.3*   PT/INR No results for input(s): "LABPROT", "INR" in the last 72 hours. CMP     Component Value Date/Time   NA 138 09/13/2022 0419   K 4.0 09/13/2022 0419   CL 107 09/13/2022 0419   CO2 26 09/13/2022 0419   GLUCOSE 92 09/13/2022 0419   BUN 6 09/13/2022 0419   CREATININE 0.76 09/13/2022 0419   CALCIUM 8.3 (L) 09/13/2022 0419   PROT 6.8 09/13/2022 0419   ALBUMIN 3.3 (L) 09/13/2022 0419   AST 28 09/13/2022 0419   ALT 51 (H) 09/13/2022 0419   ALKPHOS 51 09/13/2022 0419   BILITOT 0.7 09/13/2022 0419   GFRNONAA >60 09/13/2022 0419   GFRAA >60 01/17/2019 1227   Lipase     Component Value Date/Time   LIPASE 35 09/11/2022 1024    Studies/Results: No results found.  Anti-infectives: Anti-infectives (From admission, onward)    Start     Dose/Rate Route Frequency Ordered Stop   09/11/22 2030  piperacillin-tazobactam (ZOSYN) IVPB 3.375 g        3.375 g 12.5 mL/hr over  240 Minutes Intravenous Every 8 hours 09/11/22 1316     09/11/22 1230  piperacillin-tazobactam (ZOSYN) IVPB 3.375 g        3.375 g 100 mL/hr over 30 Minutes Intravenous  Once 09/11/22 1229 09/11/22 1350        Assessment/Plan Acute sigmoid diverticulitis with possible microperforation  - 2nd bout, never had a colonoscopy - CT 10/9 with acute diverticulitis of the left lower quadrant descending colon, no evidence of abscess, 1 extraluminal focus of gas which could represent a tiny contained perforation but no distant free air. - Symptoms resolved, WBC WNL, VSS. Adv to soft diet. Switch to po abx. Okay to d/c this pm if tolerating diet advancement (messaged TRH). Would recommend 10-14d abx total. Recommend outpatient GI follow up for colonoscopy in about 6-8 weeks.   ID - zosyn 10/9 - 10/12. Augmentin 10/12 >>  FEN - IVF, Soft VTE - lovenox Foley - none   Nephrolithiasis H/o VTE no longer on anticoagulation Morbid obesity BMI 41.53 Abnormal LFTs   LOS: 3 days    Jillyn Ledger , [facility] 09/14/2022, 8:05 AM Please see Amion for pager number during day hours 7:00am-4:30pm

## 2022-10-02 ENCOUNTER — Other Ambulatory Visit: Payer: Self-pay

## 2022-10-02 ENCOUNTER — Emergency Department (HOSPITAL_BASED_OUTPATIENT_CLINIC_OR_DEPARTMENT_OTHER): Payer: No Typology Code available for payment source

## 2022-10-02 ENCOUNTER — Emergency Department (HOSPITAL_BASED_OUTPATIENT_CLINIC_OR_DEPARTMENT_OTHER)
Admission: EM | Admit: 2022-10-02 | Discharge: 2022-10-02 | Disposition: A | Discharge status: Home / Self Care | Payer: No Typology Code available for payment source | Attending: Emergency Medicine | Admitting: Emergency Medicine

## 2022-10-02 ENCOUNTER — Encounter (HOSPITAL_BASED_OUTPATIENT_CLINIC_OR_DEPARTMENT_OTHER): Payer: Self-pay | Admitting: Urology

## 2022-10-02 DIAGNOSIS — R1032 Left lower quadrant pain: Secondary | ICD-10-CM | POA: Diagnosis present

## 2022-10-02 DIAGNOSIS — D72829 Elevated white blood cell count, unspecified: Secondary | ICD-10-CM | POA: Diagnosis not present

## 2022-10-02 DIAGNOSIS — K5792 Diverticulitis of intestine, part unspecified, without perforation or abscess without bleeding: Secondary | ICD-10-CM | POA: Insufficient documentation

## 2022-10-02 LAB — URINALYSIS, ROUTINE W REFLEX MICROSCOPIC
Glucose, UA: NEGATIVE mg/dL
Hgb urine dipstick: NEGATIVE
Ketones, ur: 15 mg/dL — AB
Leukocytes,Ua: NEGATIVE
Nitrite: NEGATIVE
Protein, ur: 30 mg/dL — AB
Specific Gravity, Urine: 1.025 (ref 1.005–1.030)
pH: 5.5 (ref 5.0–8.0)

## 2022-10-02 LAB — URINALYSIS, MICROSCOPIC (REFLEX)

## 2022-10-02 LAB — COMPREHENSIVE METABOLIC PANEL
ALT: 23 U/L (ref 0–44)
AST: 19 U/L (ref 15–41)
Albumin: 3.9 g/dL (ref 3.5–5.0)
Alkaline Phosphatase: 72 U/L (ref 38–126)
Anion gap: 8 (ref 5–15)
BUN: 13 mg/dL (ref 6–20)
CO2: 26 mmol/L (ref 22–32)
Calcium: 8.9 mg/dL (ref 8.9–10.3)
Chloride: 103 mmol/L (ref 98–111)
Creatinine, Ser: 1.07 mg/dL (ref 0.61–1.24)
GFR, Estimated: >60 mL/min (ref >60)
Glucose, Bld: 94 mg/dL (ref 70–99)
Potassium: 4 mmol/L (ref 3.5–5.1)
Sodium: 137 mmol/L (ref 135–145)
Total Bilirubin: 0.9 mg/dL (ref 0.3–1.2)
Total Protein: 8 g/dL (ref 6.5–8.1)

## 2022-10-02 LAB — CBC
HCT: 44.6 % (ref 39.0–52.0)
Hemoglobin: 15.1 g/dL (ref 13.0–17.0)
MCH: 30.3 pg (ref 26.0–34.0)
MCHC: 33.9 g/dL (ref 30.0–36.0)
MCV: 89.6 fL (ref 80.0–100.0)
Platelets: 366 10*3/uL (ref 150–400)
RBC: 4.98 MIL/uL (ref 4.22–5.81)
RDW: 13.3 % (ref 11.5–15.5)
WBC: 15.2 10*3/uL — ABNORMAL HIGH (ref 4.0–10.5)
nRBC: 0 % (ref 0.0–0.2)

## 2022-10-02 LAB — LIPASE, BLOOD: Lipase: 31 U/L (ref 11–51)

## 2022-10-02 MED ORDER — MORPHINE SULFATE (PF) 4 MG/ML IV SOLN
4.0000 mg | Freq: Once | INTRAVENOUS | Status: AC
  Administered 2022-10-02: 4 mg via INTRAVENOUS
  Filled 2022-10-02: qty 1

## 2022-10-02 MED ORDER — OXYCODONE-ACETAMINOPHEN 5-325 MG PO TABS: 1.0000 | ORAL_TABLET | Freq: Four times a day (QID) | ORAL | 0 refills | Status: DC | PRN

## 2022-10-02 MED ORDER — AMOXICILLIN-POT CLAVULANATE 875-125 MG PO TABS: 1.0000 | ORAL_TABLET | Freq: Two times a day (BID) | ORAL | 0 refills | Status: DC

## 2022-10-02 MED ORDER — SODIUM CHLORIDE 0.9 % IV BOLUS
500.0000 mL | Freq: Once | INTRAVENOUS | Status: AC
  Administered 2022-10-02: 500 mL via INTRAVENOUS

## 2022-10-02 MED ORDER — IOHEXOL 300 MG/ML  SOLN
125.0000 mL | Freq: Once | INTRAMUSCULAR | Status: AC | PRN
  Administered 2022-10-02: 125 mL via INTRAVENOUS

## 2022-10-02 NOTE — Discharge Instructions (Signed)
You were seen in the emergency department for worsening left lower quadrant abdominal pain.  Your CAT scan showed uncomplicated diverticulitis.  We are restarting you on some antibiotics.  We are also prescribing some pain medication for short-term pain relief.  Please start with a clear liquid diet and advance as tolerated.  Follow-up with your primary care doctor and general surgery.  Return to the emergency department if any high fevers or worsening symptoms.

## 2022-10-02 NOTE — ED Provider Notes (Signed)
[facility] EMERGENCY DEPARTMENT Provider Note   CSN: [id number] Arrival date & time: 10/02/22  1326     History {Add pertinent medical, surgical, social history, OB history to HPI:1} Chief Complaint  Patient presents with   Abdominal Pain    Douglas Newman is a 31 y.o. male.  He has a history of kidney stones and recurrent diverticulitis.  He was seen by me about 3 weeks ago for a left lower quadrant pain and found to have diverticulitis with a microperforation.  He was admitted and treated with IV antibiotics and sent home with oral antibiotics.  He had a general surgery consult that recommended following up with them outpatient in 6 to 8 weeks for colonoscopy and further discussion regarding possible surgery.  He said he was doing fine until yesterday when he started with some sharp stabbing left lower quadrant abdominal pain.  He does not think he has had a fever, no nausea vomiting diarrhea.  No urinary symptoms.  The history is provided by the patient.  Abdominal Pain Pain location:  LLQ Pain quality: stabbing   Pain severity:  Severe Onset quality:  Gradual Duration:  2 days Timing:  Intermittent Progression:  Unchanged Chronicity:  Recurrent Context: not trauma   Relieved by:  None tried Worsened by:  Nothing Ineffective treatments:  None tried Associated symptoms: no chills, no constipation, no cough, no diarrhea, no dysuria, no fever, no hematochezia, no melena, no nausea and no vomiting        Home Medications Prior to Admission medications   Medication Sig Start Date End Date Taking? Authorizing Provider  amoxicillin-clavulanate (AUGMENTIN) 875-125 MG tablet Take 1 tablet by mouth every 12 (twelve) hours. 09/14/22   Samuella Cota, MD  IMODIUM A-D 2 MG tablet Take 2 mg by mouth 4 (four) times daily as needed for diarrhea or loose stools.    [provider]  ondansetron (ZOFRAN-ODT) 4 MG disintegrating tablet Take 1 tablet (4 mg total) by mouth  every 8 (eight) hours as needed for nausea or vomiting. Patient not taking: Reported on 09/11/2022 08/29/22   Davonna Belling, MD      Allergies    Pollen extract    Review of Systems   Review of Systems  Constitutional:  Negative for chills and fever.  Respiratory:  Negative for cough.   Gastrointestinal:  Positive for abdominal pain. Negative for constipation, diarrhea, hematochezia, melena, nausea and vomiting.  Genitourinary:  Negative for dysuria.    Physical Exam Updated Vital Signs BP (!) 123/94 (BP Location: Left Arm)   Pulse (!) 106   Temp 98.1 F (36.7 C) (Oral)   Resp 20   Ht 6' (1.829 m)   Wt 136.1 kg   SpO2 99%   BMI 40.69 kg/m  Physical Exam Vitals and nursing note reviewed.  Constitutional:      General: He is not in acute distress.    Appearance: Normal appearance. He is well-developed.  HENT:     Head: Normocephalic and atraumatic.  Eyes:     Conjunctiva/sclera: Conjunctivae normal.  Cardiovascular:     Rate and Rhythm: Normal rate and regular rhythm.     Heart sounds: No murmur heard. Pulmonary:     Effort: Pulmonary effort is normal. No respiratory distress.     Breath sounds: Normal breath sounds.  Abdominal:     Palpations: Abdomen is soft.     Tenderness: There is no abdominal tenderness. There is no guarding or rebound.  Musculoskeletal:  General: No tenderness. Normal range of motion.     Cervical back: Neck supple.  Skin:    General: Skin is warm and dry.     Capillary Refill: Capillary refill takes less than 2 seconds.  Neurological:     General: No focal deficit present.     Mental Status: He is alert.     ED Results / Procedures / Treatments   Labs (all labs ordered are listed, but only abnormal results are displayed) Labs Reviewed  CBC - Abnormal; Notable for the following components:      Result Value   WBC 15.2 (*)    All other components within normal limits  URINALYSIS, ROUTINE W REFLEX MICROSCOPIC - Abnormal;  Notable for the following components:   Color, Urine AMBER (*)    Bilirubin Urine SMALL (*)    Ketones, ur 15 (*)    Protein, ur 30 (*)    All other components within normal limits  URINALYSIS, MICROSCOPIC (REFLEX) - Abnormal; Notable for the following components:   Bacteria, UA RARE (*)    All other components within normal limits  LIPASE, BLOOD  COMPREHENSIVE METABOLIC PANEL    EKG None  Radiology No results found.  Procedures Procedures  {Document cardiac monitor, telemetry assessment procedure when appropriate:1}  Medications Ordered in ED Medications  morphine (PF) 4 MG/ML injection 4 mg (has no administration in time range)  sodium chloride 0.9 % bolus 500 mL (has no administration in time range)    ED Course/ Medical Decision Making/ A&P                           Medical Decision Making Amount and/or Complexity of Data Reviewed Labs: ordered. Radiology: ordered.  Risk Prescription drug management.   This patient complains of ***; this involves an extensive number of treatment Options and is a complaint that carries with it a high risk of complications and morbidity. The differential includes ***  I ordered, reviewed and interpreted labs, which included *** I ordered medication *** and reviewed PMP when indicated. I ordered imaging studies which included *** and I independently    visualized and interpreted imaging which showed *** Additional history obtained from *** Previous records obtained and reviewed *** I consulted *** and discussed lab and imaging findings and discussed disposition.  Cardiac monitoring reviewed, *** Social determinants considered, *** Critical Interventions: ***  After the interventions stated above, I reevaluated the patient and found *** Admission and further testing considered, ***   {Document critical care time when appropriate:1} {Document review of labs and clinical decision tools ie heart score, Chads2Vasc2 etc:1}   {Document your independent review of radiology images, and any outside records:1} {Document your discussion with family members, caretakers, and with consultants:1} {Document social determinants of health affecting pt's care:1} {Document your decision making why or why not admission, treatments were needed:1} Final Clinical Impression(s) / ED Diagnoses Final diagnoses:  None    Rx / DC Orders ED Discharge Orders     None

## 2022-10-02 NOTE — ED Triage Notes (Signed)
LLQ pain that returned yesterday  Denies N/V, states constipated, last BM today but it was not normal per pt  Was admitted on 10/9 for Diverticulitis

## 2022-10-02 NOTE — ED Notes (Signed)
Patient and mother verbalized understanding of discharge instructions and reasons to return to the ED 

## 2023-12-29 ENCOUNTER — Emergency Department (HOSPITAL_BASED_OUTPATIENT_CLINIC_OR_DEPARTMENT_OTHER): Payer: No Typology Code available for payment source

## 2023-12-29 ENCOUNTER — Other Ambulatory Visit: Payer: Self-pay

## 2023-12-29 ENCOUNTER — Encounter (HOSPITAL_BASED_OUTPATIENT_CLINIC_OR_DEPARTMENT_OTHER): Payer: Self-pay

## 2023-12-29 ENCOUNTER — Emergency Department (HOSPITAL_BASED_OUTPATIENT_CLINIC_OR_DEPARTMENT_OTHER)
Admission: EM | Admit: 2023-12-29 | Discharge: 2023-12-29 | Disposition: A | Discharge status: Home / Self Care | Payer: No Typology Code available for payment source | Attending: Emergency Medicine | Admitting: Emergency Medicine

## 2023-12-29 DIAGNOSIS — Z87891 Personal history of nicotine dependence: Secondary | ICD-10-CM | POA: Diagnosis not present

## 2023-12-29 DIAGNOSIS — R109 Unspecified abdominal pain: Secondary | ICD-10-CM | POA: Diagnosis present

## 2023-12-29 DIAGNOSIS — K5730 Diverticulosis of large intestine without perforation or abscess without bleeding: Secondary | ICD-10-CM | POA: Diagnosis not present

## 2023-12-29 DIAGNOSIS — N200 Calculus of kidney: Secondary | ICD-10-CM

## 2023-12-29 DIAGNOSIS — F1290 Cannabis use, unspecified, uncomplicated: Secondary | ICD-10-CM

## 2023-12-29 DIAGNOSIS — K5790 Diverticulosis of intestine, part unspecified, without perforation or abscess without bleeding: Secondary | ICD-10-CM

## 2023-12-29 DIAGNOSIS — F1590 Other stimulant use, unspecified, uncomplicated: Secondary | ICD-10-CM | POA: Insufficient documentation

## 2023-12-29 DIAGNOSIS — R112 Nausea with vomiting, unspecified: Secondary | ICD-10-CM

## 2023-12-29 LAB — RAPID URINE DRUG SCREEN, HOSP PERFORMED
Amphetamines: NOT DETECTED
Barbiturates: NOT DETECTED
Benzodiazepines: NOT DETECTED
Cocaine: NOT DETECTED
Opiates: POSITIVE — AB
Tetrahydrocannabinol: POSITIVE — AB

## 2023-12-29 LAB — COMPREHENSIVE METABOLIC PANEL
ALT: 38 U/L (ref 0–44)
AST: 27 U/L (ref 15–41)
Albumin: 4 g/dL (ref 3.5–5.0)
Alkaline Phosphatase: 59 U/L (ref 38–126)
Anion gap: 11 (ref 5–15)
BUN: 12 mg/dL (ref 6–20)
CO2: 23 mmol/L (ref 22–32)
Calcium: 8.9 mg/dL (ref 8.9–10.3)
Chloride: 104 mmol/L (ref 98–111)
Creatinine, Ser: 0.98 mg/dL (ref 0.61–1.24)
GFR, Estimated: >60 mL/min (ref >60)
Glucose, Bld: 119 mg/dL — ABNORMAL HIGH (ref 70–99)
Potassium: 3.6 mmol/L (ref 3.5–5.1)
Sodium: 138 mmol/L (ref 135–145)
Total Bilirubin: 0.6 mg/dL (ref 0.0–1.2)
Total Protein: 8 g/dL (ref 6.5–8.1)

## 2023-12-29 LAB — CBC WITH DIFFERENTIAL/PLATELET
Abs Immature Granulocytes: 0.06 10*3/uL (ref 0.00–0.07)
Basophils Absolute: 0.1 10*3/uL (ref 0.0–0.1)
Basophils Relative: 1 %
Eosinophils Absolute: 0.4 10*3/uL (ref 0.0–0.5)
Eosinophils Relative: 3 %
HCT: 44.9 % (ref 39.0–52.0)
Hemoglobin: 15.8 g/dL (ref 13.0–17.0)
Immature Granulocytes: 1 %
Lymphocytes Relative: 29 %
Lymphs Abs: 3.4 10*3/uL (ref 0.7–4.0)
MCH: 31.5 pg (ref 26.0–34.0)
MCHC: 35.2 g/dL (ref 30.0–36.0)
MCV: 89.4 fL (ref 80.0–100.0)
Monocytes Absolute: 1.1 10*3/uL — ABNORMAL HIGH (ref 0.1–1.0)
Monocytes Relative: 10 %
Neutro Abs: 6.6 10*3/uL (ref 1.7–7.7)
Neutrophils Relative %: 56 %
Platelets: 408 10*3/uL — ABNORMAL HIGH (ref 150–400)
RBC: 5.02 MIL/uL (ref 4.22–5.81)
RDW: 13.2 % (ref 11.5–15.5)
WBC: 11.6 10*3/uL — ABNORMAL HIGH (ref 4.0–10.5)
nRBC: 0 % (ref 0.0–0.2)

## 2023-12-29 LAB — URINALYSIS, ROUTINE W REFLEX MICROSCOPIC
Bilirubin Urine: NEGATIVE
Glucose, UA: NEGATIVE mg/dL
Ketones, ur: NEGATIVE mg/dL
Leukocytes,Ua: NEGATIVE
Nitrite: NEGATIVE
Protein, ur: NEGATIVE mg/dL
Specific Gravity, Urine: 1.01 (ref 1.005–1.030)
pH: 5.5 (ref 5.0–8.0)

## 2023-12-29 LAB — URINALYSIS, MICROSCOPIC (REFLEX)

## 2023-12-29 LAB — LIPASE, BLOOD: Lipase: 34 U/L (ref 11–51)

## 2023-12-29 MED ORDER — MORPHINE SULFATE (PF) 4 MG/ML IV SOLN
4.0000 mg | Freq: Once | INTRAVENOUS | Status: AC
  Administered 2023-12-29: 4 mg via INTRAVENOUS
  Filled 2023-12-29: qty 1

## 2023-12-29 MED ORDER — DIPHENHYDRAMINE HCL 50 MG/ML IJ SOLN
25.0000 mg | Freq: Once | INTRAMUSCULAR | Status: AC
  Administered 2023-12-29: 25 mg via INTRAVENOUS
  Filled 2023-12-29: qty 1

## 2023-12-29 MED ORDER — PROCHLORPERAZINE EDISYLATE 10 MG/2ML IJ SOLN
5.0000 mg | Freq: Once | INTRAMUSCULAR | Status: AC
  Administered 2023-12-29: 5 mg via INTRAVENOUS
  Filled 2023-12-29: qty 2

## 2023-12-29 MED ORDER — IBUPROFEN 600 MG PO TABS
600.0000 mg | ORAL_TABLET | Freq: Four times a day (QID) | ORAL | 0 refills | Status: DC | PRN
Start: 2023-12-29 — End: 2024-02-04

## 2023-12-29 MED ORDER — SODIUM CHLORIDE 0.9 % IV BOLUS
1000.0000 mL | Freq: Once | INTRAVENOUS | Status: AC
  Administered 2023-12-29: 1000 mL via INTRAVENOUS

## 2023-12-29 MED ORDER — ONDANSETRON HCL 4 MG/2ML IJ SOLN
4.0000 mg | Freq: Once | INTRAMUSCULAR | Status: AC
  Administered 2023-12-29: 4 mg via INTRAVENOUS
  Filled 2023-12-29: qty 2

## 2023-12-29 MED ORDER — ONDANSETRON HCL 4 MG PO TABS: 4.0000 mg | ORAL_TABLET | ORAL | 0 refills | Status: DC | PRN

## 2023-12-29 MED ORDER — ACETAMINOPHEN 325 MG PO TABS
650.0000 mg | ORAL_TABLET | Freq: Four times a day (QID) | ORAL | 0 refills | Status: DC | PRN
Start: 2023-12-29 — End: 2024-02-04

## 2023-12-29 MED ORDER — OXYCODONE HCL 5 MG PO TABS: 5.0000 mg | ORAL_TABLET | ORAL | 0 refills | Status: DC | PRN

## 2023-12-29 MED ORDER — IOHEXOL 300 MG/ML  SOLN
100.0000 mL | Freq: Once | INTRAMUSCULAR | Status: AC | PRN
  Administered 2023-12-29: 100 mL via INTRAVENOUS

## 2023-12-29 MED ORDER — KETOROLAC TROMETHAMINE 15 MG/ML IJ SOLN
15.0000 mg | Freq: Once | INTRAMUSCULAR | Status: AC
  Administered 2023-12-29: 15 mg via INTRAVENOUS
  Filled 2023-12-29: qty 1

## 2023-12-29 NOTE — ED Notes (Signed)
Pt given urinal and reminded that urine sample is needed.

## 2023-12-29 NOTE — ED Triage Notes (Signed)
Pt states he has been having extreme pain in his right flank that woke him from his sleep. Has been vomiting since he woke as well. Reports history of kidney stone & states it feels similar.  Pt actively vomiting in triage.

## 2023-12-29 NOTE — Discharge Instructions (Addendum)
Please refrain from marijuana use in the future  Drink plenty of fluids, eat a bland diet   Follow-up with your PCP, you may have sleep apnea  It was a pleasure caring for you today in the emergency department.  Please return to the emergency department for any worsening or worrisome symptoms.

## 2023-12-29 NOTE — ED Provider Notes (Signed)
[facility] Provider Note  CSN: [id number] Arrival date & time: 12/29/23 0020  Chief Complaint(s) Flank Pain and Emesis  HPI Douglas Newman is a 33 y.o. male with past medical history as below, significant for diverticulitis, PE, obesity   Who presents to the ED with complaint of nausea vomiting, right flank pain.  Patient reports painful similar to prior kidney stone.  Sudden onset of pain this evening, woke up from sleep.  Right lower quadrant, right flank.  Nausea and vomiting.  No change in bowel or bladder function,p.o. intake, no fevers.  No recent travel or sick contacts.  Does use THC  Past Medical History Past Medical History:  Diagnosis Date   Diverticulitis    Pulmonary embolism [facility])    Patient Active Problem List   Diagnosis Date Noted   Acute diverticulitis 09/11/2022   History of pulmonary embolism 09/11/2022   Abnormal LFTs 09/11/2022   Leukocytosis 09/11/2022   Obesity, Class III, BMI 40-49.9 (morbid obesity) (HCC) 09/11/2022   Home Medication(s) Prior to Admission medications   Medication Sig Start Date End Date Taking? Authorizing Provider  acetaminophen (TYLENOL) 325 MG tablet Take 2 tablets (650 mg total) by mouth every 6 (six) hours as needed. 12/29/23  Yes Tanda Rockers A, DO  ibuprofen (ADVIL) 600 MG tablet Take 1 tablet (600 mg total) by mouth every 6 (six) hours as needed. 12/29/23  Yes Tanda Rockers A, DO  ondansetron (ZOFRAN) 4 MG tablet Take 1 tablet (4 mg total) by mouth every 4 (four) hours as needed for nausea or vomiting. 12/29/23  Yes Tanda Rockers A, DO  oxyCODONE (ROXICODONE) 5 MG immediate release tablet Take 1 tablet (5 mg total) by mouth every 4 (four) hours as needed for severe pain (pain score 7-10). 12/29/23  Yes Sloan Leiter, DO  IMODIUM A-D 2 MG tablet Take 2 mg by mouth 4 (four) times daily as needed for diarrhea or loose stools.    [provider]                                                                                                                                     Past Surgical History History reviewed. No pertinent surgical history. Family History History reviewed. No pertinent family history.  Social History Social History   Tobacco Use   Smoking status: Former   Smokeless tobacco: Former  Building services engineer status: Every Day  Substance Use Topics   Alcohol use: No   Drug use: No   Allergies Pollen extract  Review of Systems Review of Systems  Constitutional:  Negative for chills and fever.  Respiratory:  Negative for shortness of breath.   Gastrointestinal:  Positive for abdominal pain, nausea and vomiting.  Genitourinary:  Negative for dysuria.  Musculoskeletal:  Negative for arthralgias.  Neurological:  Negative for syncope.  All other systems reviewed and are negative.  Physical Exam Vital Signs  I have reviewed the triage vital signs BP 113/67   Pulse 83   Temp 97.7 F (36.5 C) (Oral)   Resp (!) 22   Ht 6' (1.829 m)   Wt (!) 140.6 kg   SpO2 92%   BMI 42.04 kg/m  Physical Exam Vitals and nursing note reviewed.  Constitutional:      General: He is not in acute distress.    Appearance: He is well-developed.  HENT:     Head: Normocephalic and atraumatic.     Right Ear: External ear normal.     Left Ear: External ear normal.     Mouth/Throat:     Mouth: Mucous membranes are moist.  Eyes:     General: No scleral icterus. Cardiovascular:     Rate and Rhythm: Normal rate and regular rhythm.     Pulses: Normal pulses.     Heart sounds: Normal heart sounds.  Pulmonary:     Effort: Pulmonary effort is normal. No respiratory distress.     Breath sounds: Normal breath sounds.  Abdominal:     General: Abdomen is flat.     Palpations: Abdomen is soft.     Tenderness: There is abdominal tenderness. There is no guarding.    Musculoskeletal:     Cervical back: No rigidity.     Right lower leg: No edema.     Left lower leg: No  edema.  Skin:    General: Skin is warm and dry.     Capillary Refill: Capillary refill takes less than 2 seconds.  Neurological:     Mental Status: He is alert.  Psychiatric:        Mood and Affect: Mood normal.        Behavior: Behavior normal.     ED Results and Treatments Labs (all labs ordered are listed, but only abnormal results are displayed) Labs Reviewed  CBC WITH DIFFERENTIAL/PLATELET - Abnormal; Notable for the following components:      Result Value   WBC 11.6 (*)    Platelets 408 (*)    Monocytes Absolute 1.1 (*)    All other components within normal limits  COMPREHENSIVE METABOLIC PANEL - Abnormal; Notable for the following components:   Glucose, Bld 119 (*)    All other components within normal limits  URINALYSIS, ROUTINE W REFLEX MICROSCOPIC - Abnormal; Notable for the following components:   Hgb urine dipstick LARGE (*)    All other components within normal limits  RAPID URINE DRUG SCREEN, HOSP PERFORMED - Abnormal; Notable for the following components:   Opiates POSITIVE (*)    Tetrahydrocannabinol POSITIVE (*)    All other components within normal limits  URINALYSIS, MICROSCOPIC (REFLEX) - Abnormal; Notable for the following components:   Bacteria, UA RARE (*)    All other components within normal limits  LIPASE, BLOOD  Radiology CT ABDOMEN PELVIS W CONTRAST Result Date: 12/29/2023 CLINICAL DATA:  Right-sided abdominal pain. EXAM: CT ABDOMEN AND PELVIS WITH CONTRAST TECHNIQUE: Multidetector CT imaging of the abdomen and pelvis was performed using the standard protocol following bolus administration of intravenous contrast. RADIATION DOSE REDUCTION: This exam was performed according to the departmental dose-optimization program which includes automated exposure control, adjustment of the mA and/or kV according to patient size and/or use of  iterative reconstruction technique. CONTRAST:  OMNIPAQUE IOHEXOL 300 MG/ML  SOLN COMPARISON:  CT abdomen and pelvis 10/02/2022 FINDINGS: Lower chest: No acute abnormality. Hepatobiliary: No focal liver abnormality is seen. No gallstones, gallbladder wall thickening, or biliary dilatation. Pancreas: Unremarkable. No pancreatic ductal dilatation or surrounding inflammatory changes. Spleen: Normal in size without focal abnormality. Adrenals/Urinary Tract: There is a punctate calculus in the bladder at the level of the right ureterovesicular junction. There is no hydronephrosis. The kidneys and ureters are within normal limits. The adrenal glands are within normal limits. Stomach/Bowel: Stomach is within normal limits. Appendix appears normal. No evidence of bowel wall thickening, distention, or inflammatory changes. There is sigmoid colon diverticulosis. Vascular/Lymphatic: No significant vascular findings are present. No enlarged abdominal or pelvic lymph nodes. Reproductive: Prostate is unremarkable. Other: No abdominal wall hernia or abnormality. No abdominopelvic ascites. Musculoskeletal: No acute or significant osseous findings. IMPRESSION: 1. Punctate calculus in the bladder at the level of the right ureterovesicular junction. No hydronephrosis. 2. Sigmoid colon diverticulosis. Electronically Signed   By: Darliss Cheney M.D.   On: 12/29/2023 01:54    Pertinent labs & imaging results that were available during my care of the patient were reviewed by me and considered in my medical decision making (see MDM for details).  Medications Ordered in ED Medications  sodium chloride 0.9 % bolus 1,000 mL (0 mLs Intravenous Stopped 12/29/23 0209)  ketorolac (TORADOL) 15 MG/ML injection 15 mg (15 mg Intravenous Given 12/29/23 0047)  ondansetron (ZOFRAN) injection 4 mg (4 mg Intravenous Given 12/29/23 0047)  iohexol (OMNIPAQUE) 300 MG/ML solution 100 mL (100 mLs Intravenous Contrast Given 12/29/23 0148)   prochlorperazine (COMPAZINE) injection 5 mg (5 mg Intravenous Given 12/29/23 0126)  diphenhydrAMINE (BENADRYL) injection 25 mg (25 mg Intravenous Given 12/29/23 0126)  morphine (PF) 4 MG/ML injection 4 mg (4 mg Intravenous Given 12/29/23 0155)                                                                                                                                     Procedures Procedures  (including critical care time)  Medical Decision Making / ED Course    Medical Decision Making:    Kendon Sedeno is a 33 y.o. male with past medical history as below, significant for diverticulitis, PE, obesity   Who presents to the ED with complaint of nausea vomiting, right flank pain.. The complaint involves an extensive differential diagnosis and also carries with it a high risk of complications  and morbidity.  Serious etiology was considered. Ddx includes but is not limited to: Differential diagnosis includes but is not exclusive to acute appendicitis, renal colic, testicular torsion, urinary tract infection, prostatitis,  diverticulitis, small bowel obstruction, colitis, abdominal aortic aneurysm, gastroenteritis, constipation etc.   Complete initial physical exam performed, notably the patient was in no acute distress, he is very nauseated, vomited multiple times in triage.    Reviewed and confirmed nursing documentation for past medical history, family history, social history.  Vital signs reviewed.      Clinical Course as of 12/29/23 0344  Sat Dec 29, 2023  0203 Punctate calculus noted on CT a/p [SG]  0330 Feeling better  [SG]  0332 Tetrahydrocannabinol(!): POSITIVE [SG]  0332 Creatinine: 0.98 Renal function stable [SG]  0333 Tolerating po, pain resolved. [SG]    Clinical Course User Index [SG] Sloan Leiter, DO    Brief summary: 33 year old male with history as above here with right lower quadrant right flank pain.  Labs reviewed, these are stable, renal function is normal.  He  has mild leukocytosis, likely stress response in setting of pain and vomiting.  UDS with THC and opiates, he was given opiates during his stay in the ER.  UA w/ blood, doubt acute infection.  CT with punctate nephrolithiasis also shows some diverticulosis  On recheck he is feeling much better, he is tolerant p.o. without difficulty.  Resting comfortably.  Encouraged THC cessation.  Encouraged kidney stone prevention diet.  Rehydration.  The patient improved significantly and was discharged in stable condition. Detailed discussions were had with the patient/guardian regarding current findings, and need for close f/u with PCP or on call doctor. The patient/guardian has been instructed to return immediately if the symptoms worsen in any way for re-evaluation. Patient/guardian verbalized understanding and is in agreement with current care plan. All questions answered prior to discharge.                  Additional history obtained: -Additional history obtained from na -External records from outside source obtained and reviewed including: Chart review including previous notes, labs, imaging, consultation notes including  PDMP, prior labs and imaging, [facility] documentation   Lab Tests: -I ordered, reviewed, and interpreted labs.   The pertinent results include:   Labs Reviewed  CBC WITH DIFFERENTIAL/PLATELET - Abnormal; Notable for the following components:      Result Value   WBC 11.6 (*)    Platelets 408 (*)    Monocytes Absolute 1.1 (*)    All other components within normal limits  COMPREHENSIVE METABOLIC PANEL - Abnormal; Notable for the following components:   Glucose, Bld 119 (*)    All other components within normal limits  URINALYSIS, ROUTINE W REFLEX MICROSCOPIC - Abnormal; Notable for the following components:   Hgb urine dipstick LARGE (*)    All other components within normal limits  RAPID URINE DRUG SCREEN, HOSP PERFORMED - Abnormal; Notable for the following components:    Opiates POSITIVE (*)    Tetrahydrocannabinol POSITIVE (*)    All other components within normal limits  URINALYSIS, MICROSCOPIC (REFLEX) - Abnormal; Notable for the following components:   Bacteria, UA RARE (*)    All other components within normal limits  LIPASE, BLOOD    Notable for as above  EKG   EKG Interpretation Date/Time:    Ventricular Rate:    PR Interval:    QRS Duration:    QT Interval:    QTC Calculation:   R  Axis:      Text Interpretation:           Imaging Studies ordered: I ordered imaging studies including ctap I independently visualized the following imaging with scope of interpretation limited to determining acute life threatening conditions related to emergency care; findings noted above I independently visualized and interpreted imaging. I agree with the radiologist interpretation   Medicines ordered and prescription drug management: Meds ordered this encounter  Medications   sodium chloride 0.9 % bolus 1,000 mL   ketorolac (TORADOL) 15 MG/ML injection 15 mg   ondansetron (ZOFRAN) injection 4 mg   iohexol (OMNIPAQUE) 300 MG/ML solution 100 mL   prochlorperazine (COMPAZINE) injection 5 mg   diphenhydrAMINE (BENADRYL) injection 25 mg   morphine (PF) 4 MG/ML injection 4 mg   oxyCODONE (ROXICODONE) 5 MG immediate release tablet    Sig: Take 1 tablet (5 mg total) by mouth every 4 (four) hours as needed for severe pain (pain score 7-10).    Dispense:  5 tablet    Refill:  0   ondansetron (ZOFRAN) 4 MG tablet    Sig: Take 1 tablet (4 mg total) by mouth every 4 (four) hours as needed for nausea or vomiting.    Dispense:  12 tablet    Refill:  0   acetaminophen (TYLENOL) 325 MG tablet    Sig: Take 2 tablets (650 mg total) by mouth every 6 (six) hours as needed.    Dispense:  36 tablet    Refill:  0   ibuprofen (ADVIL) 600 MG tablet    Sig: Take 1 tablet (600 mg total) by mouth every 6 (six) hours as needed.    Dispense:  30 tablet    Refill:   0    -I have reviewed the patients home medicines and have made adjustments as needed   Consultations Obtained: na   Cardiac Monitoring: Continuous pulse oximetry interpreted by myself, 100% on RA.    Social Determinants of Health:  Diagnosis or treatment significantly limited by social determinants of health: obesity, thc   Reevaluation: After the interventions noted above, I reevaluated the patient and found that they have improved  Co morbidities that complicate the patient evaluation  Past Medical History:  Diagnosis Date   Diverticulitis    Pulmonary embolism (HCC)       Dispostion: Disposition decision including need for hospitalization was considered, and patient discharged from emergency department.    Final Clinical Impression(s) / ED Diagnoses Final diagnoses:  Nephrolithiasis  Marijuana use  Nausea and vomiting, unspecified vomiting type  Diverticulosis        Sloan Leiter, DO 12/29/23 [phone]

## 2024-02-04 ENCOUNTER — Emergency Department (HOSPITAL_BASED_OUTPATIENT_CLINIC_OR_DEPARTMENT_OTHER)
Admission: EM | Admit: 2024-02-04 | Discharge: 2024-02-04 | Disposition: A | Discharge status: Home / Self Care | Attending: Emergency Medicine | Admitting: Emergency Medicine

## 2024-02-04 ENCOUNTER — Encounter (HOSPITAL_BASED_OUTPATIENT_CLINIC_OR_DEPARTMENT_OTHER): Payer: Self-pay | Admitting: Emergency Medicine

## 2024-02-04 ENCOUNTER — Other Ambulatory Visit: Payer: Self-pay

## 2024-02-04 ENCOUNTER — Emergency Department (HOSPITAL_BASED_OUTPATIENT_CLINIC_OR_DEPARTMENT_OTHER)

## 2024-02-04 DIAGNOSIS — R197 Diarrhea, unspecified: Secondary | ICD-10-CM | POA: Diagnosis not present

## 2024-02-04 DIAGNOSIS — R1031 Right lower quadrant pain: Secondary | ICD-10-CM | POA: Insufficient documentation

## 2024-02-04 DIAGNOSIS — D72829 Elevated white blood cell count, unspecified: Secondary | ICD-10-CM | POA: Insufficient documentation

## 2024-02-04 DIAGNOSIS — R112 Nausea with vomiting, unspecified: Secondary | ICD-10-CM | POA: Insufficient documentation

## 2024-02-04 DIAGNOSIS — R109 Unspecified abdominal pain: Secondary | ICD-10-CM

## 2024-02-04 LAB — COMPREHENSIVE METABOLIC PANEL
ALT: 47 U/L — ABNORMAL HIGH (ref 0–44)
AST: 29 U/L (ref 15–41)
Albumin: 4.3 g/dL (ref 3.5–5.0)
Alkaline Phosphatase: 60 U/L (ref 38–126)
Anion gap: 11 (ref 5–15)
BUN: 13 mg/dL (ref 6–20)
CO2: 21 mmol/L — ABNORMAL LOW (ref 22–32)
Calcium: 9.4 mg/dL (ref 8.9–10.3)
Chloride: 106 mmol/L (ref 98–111)
Creatinine, Ser: 0.96 mg/dL (ref 0.61–1.24)
GFR, Estimated: >60 mL/min (ref >60)
Glucose, Bld: 98 mg/dL (ref 70–99)
Potassium: 3.9 mmol/L (ref 3.5–5.1)
Sodium: 138 mmol/L (ref 135–145)
Total Bilirubin: 0.7 mg/dL (ref 0.0–1.2)
Total Protein: 8 g/dL (ref 6.5–8.1)

## 2024-02-04 LAB — CBC
HCT: 47.9 % (ref 39.0–52.0)
Hemoglobin: 16.5 g/dL (ref 13.0–17.0)
MCH: 31.2 pg (ref 26.0–34.0)
MCHC: 34.4 g/dL (ref 30.0–36.0)
MCV: 90.5 fL (ref 80.0–100.0)
Platelets: 417 10*3/uL — ABNORMAL HIGH (ref 150–400)
RBC: 5.29 MIL/uL (ref 4.22–5.81)
RDW: 13.2 % (ref 11.5–15.5)
WBC: 11.1 10*3/uL — ABNORMAL HIGH (ref 4.0–10.5)
nRBC: 0 % (ref 0.0–0.2)

## 2024-02-04 LAB — URINALYSIS, ROUTINE W REFLEX MICROSCOPIC
Bilirubin Urine: NEGATIVE
Glucose, UA: NEGATIVE mg/dL
Hgb urine dipstick: NEGATIVE
Ketones, ur: NEGATIVE mg/dL
Leukocytes,Ua: NEGATIVE
Nitrite: NEGATIVE
Protein, ur: NEGATIVE mg/dL
Specific Gravity, Urine: >=1.03 (ref 1.005–1.030)
pH: 5 (ref 5.0–8.0)

## 2024-02-04 LAB — LIPASE, BLOOD: Lipase: 38 U/L (ref 11–51)

## 2024-02-04 MED ORDER — ONDANSETRON HCL 4 MG/2ML IJ SOLN
4.0000 mg | Freq: Once | INTRAMUSCULAR | Status: AC
  Administered 2024-02-04: 4 mg via INTRAVENOUS
  Filled 2024-02-04: qty 2

## 2024-02-04 MED ORDER — IOHEXOL 300 MG/ML  SOLN
125.0000 mL | Freq: Once | INTRAMUSCULAR | Status: AC | PRN
  Administered 2024-02-04: 125 mL via INTRAVENOUS

## 2024-02-04 MED ORDER — LOPERAMIDE HCL 2 MG PO CAPS: 2.0000 mg | ORAL_CAPSULE | Freq: Four times a day (QID) | ORAL | 0 refills | Status: AC | PRN

## 2024-02-04 MED ORDER — METOCLOPRAMIDE HCL 5 MG/ML IJ SOLN
10.0000 mg | Freq: Once | INTRAMUSCULAR | Status: AC
  Administered 2024-02-04: 10 mg via INTRAVENOUS
  Filled 2024-02-04: qty 2

## 2024-02-04 MED ORDER — ONDANSETRON 4 MG PO TBDP: 8.0000 mg | ORAL_TABLET | Freq: Once | ORAL | Status: DC

## 2024-02-04 MED ORDER — DICYCLOMINE HCL 20 MG PO TABS: 20.0000 mg | ORAL_TABLET | Freq: Two times a day (BID) | ORAL | 0 refills | Status: AC

## 2024-02-04 NOTE — ED Provider Notes (Signed)
 Signout from [employer] at shift change.    Plan: Follow-up on CT results   8:28 PM Reassessment performed. Patient appears stable, comfortable.  States pain currently resolved.  Abdomen soft and nontender.  Labs and imaging personally reviewed and interpreted including: CBC with minimally elevated white blood cell count; CMP unremarkable except for minimally elevated ALT at 47; UA concentrated but no compelling signs of infection; lipase normal.   Most current vital signs reviewed and are as follows: BP (!) 133/90   Pulse 91   Temp 98.3 F (36.8 C)   Resp 18   Ht 6' (1.829 m)   Wt 104.6 kg   SpO2 100%   BMI 31.28 kg/m   Plan: Discharged home   Home treatment: Bentyl, Imodium for symptom control   Return and follow-up instructions: The patient was urged to return to the Emergency Department immediately with worsening of current symptoms, worsening abdominal pain, persistent vomiting, blood noted in stools, fever, or any other concerns. The patient verbalized understanding.   Encouraged patient to follow-up with their provider in 7 days. Patient verbalized understanding and agreed with plan.        Renne Crigler, PA-C 02/04/24 2029    Arby Barrette, MD 02/10/24 [phone]

## 2024-02-04 NOTE — ED Provider Notes (Signed)
 [facility] Provider Note   CSN: [id number] Arrival date & time: 02/04/24  1207     History  Chief Complaint  Patient presents with   Abdominal Pain    Douglas Newman is a 33 y.o. male with medical history of diverticulitis, PE.  The patient presents to the ED for evaluation of abdominal pain, nausea, vomiting and diarrhea.  Reports that he has off-and-on abdominal pain for the last 1 month.  He was seen in this ED on 1/25 with unremarkable workup, came in for right lower quadrant pain.  The patient states that beginning on Saturday of this past week his abdominal pain became more constant, primarily located in the right lower quadrant of his abdomen.  He reports he has had nausea, vomiting and diarrhea beginning today.  Denies any blood in his stool.  Denies any dysuria, flank pain.  Denies fevers at home, chest pain, shortness of breath.   Abdominal Pain Associated symptoms: nausea and vomiting        Home Medications Prior to Admission medications   Medication Sig Start Date End Date Taking? Authorizing Provider  dicyclomine (BENTYL) 20 MG tablet Take 1 tablet (20 mg total) by mouth 2 (two) times daily. 02/04/24  Yes Renne Crigler, PA-C  loperamide (IMODIUM) 2 MG capsule Take 1 capsule (2 mg total) by mouth 4 (four) times daily as needed for diarrhea or loose stools. 02/04/24  Yes Renne Crigler, PA-C      Allergies    Pollen extract    Review of Systems   Review of Systems  Gastrointestinal:  Positive for abdominal pain, nausea and vomiting.  All other systems reviewed and are negative.   Physical Exam Updated Vital Signs BP (!) 133/90   Pulse 91   Temp 98.3 F (36.8 C)   Resp 18   Ht 6' (1.829 m)   Wt 104.6 kg   SpO2 100%   BMI 31.28 kg/m  Physical Exam Vitals and nursing note reviewed.  Constitutional:      General: He is not in acute distress.    Appearance: Normal appearance. He is well-developed.  HENT:      Head: Normocephalic and atraumatic.  Eyes:     Conjunctiva/sclera: Conjunctivae normal.  Cardiovascular:     Rate and Rhythm: Normal rate and regular rhythm.     Pulses: Normal pulses.     Heart sounds: No murmur heard. Pulmonary:     Effort: Pulmonary effort is normal. No respiratory distress.     Breath sounds: Normal breath sounds. No wheezing or rales.  Chest:     Chest wall: No tenderness.  Abdominal:     General: Abdomen is flat. There is no distension.     Palpations: Abdomen is soft.     Tenderness: There is abdominal tenderness. There is no right CVA tenderness, left CVA tenderness or guarding.     Comments: RLQ tenderness  Musculoskeletal:        General: No swelling.     Cervical back: Neck supple.     Right lower leg: No edema.     Left lower leg: No edema.  Skin:    General: Skin is warm and dry.     Capillary Refill: Capillary refill takes less than 2 seconds.  Neurological:     General: No focal deficit present.     Mental Status: He is alert and oriented to person, place, and time.     Cranial Nerves: No cranial  nerve deficit.     Sensory: No sensory deficit.     Motor: No weakness.  Psychiatric:        Mood and Affect: Mood normal.        Behavior: Behavior normal.     ED Results / Procedures / Treatments   Labs (all labs ordered are listed, but only abnormal results are displayed) Labs Reviewed  COMPREHENSIVE METABOLIC PANEL - Abnormal; Notable for the following components:      Result Value   CO2 21 (*)    ALT 47 (*)    All other components within normal limits  CBC - Abnormal; Notable for the following components:   WBC 11.1 (*)    Platelets 417 (*)    All other components within normal limits  LIPASE, BLOOD  URINALYSIS, ROUTINE W REFLEX MICROSCOPIC    EKG None  Radiology CT ABDOMEN PELVIS W CONTRAST Result Date: 02/04/2024 CLINICAL DATA:  Right lower quadrant abdominal pain. EXAM: CT ABDOMEN AND PELVIS WITH CONTRAST TECHNIQUE:  Multidetector CT imaging of the abdomen and pelvis was performed using the standard protocol following bolus administration of intravenous contrast. RADIATION DOSE REDUCTION: This exam was performed according to the departmental dose-optimization program which includes automated exposure control, adjustment of the mA and/or kV according to patient size and/or use of iterative reconstruction technique. CONTRAST:  OMNIPAQUE IOHEXOL 300 MG/ML  SOLN COMPARISON:  None Available. FINDINGS: Lower chest: Mild scarring and/or atelectasis is seen within the bilateral lung bases. Hepatobiliary: There is mild, diffuse fatty infiltration of the liver parenchyma. No focal liver abnormality is seen. No gallstones, gallbladder wall thickening, or biliary dilatation. Pancreas: Unremarkable. No pancreatic ductal dilatation or surrounding inflammatory changes. Spleen: Normal in size without focal abnormality. Adrenals/Urinary Tract: Adrenal glands are unremarkable. Kidneys are normal, without renal calculi, focal lesion, or hydronephrosis. Bladder is unremarkable. Stomach/Bowel: Stomach is within normal limits. Appendix appears normal. No evidence of bowel wall thickening, distention, or inflammatory changes. Noninflamed diverticula are seen throughout the large bowel. This is most prominent within the sigmoid colon. Vascular/Lymphatic: No significant vascular findings are present. No enlarged abdominal or pelvic lymph nodes. Reproductive: Prostate is unremarkable. Other: No abdominal wall hernia or abnormality. No abdominopelvic ascites. Musculoskeletal: No acute or significant osseous findings. IMPRESSION: 1. Colonic diverticulosis. 2. Mild hepatic steatosis. 3. Normal appendix. Electronically Signed   By: Aram Candela M.D.   On: 02/04/2024 20:09    Procedures Procedures   Medications Ordered in ED Medications  ondansetron (ZOFRAN) injection 4 mg (4 mg Intravenous Given 02/04/24 1616)  iohexol (OMNIPAQUE) 300 MG/ML  solution 125 mL (125 mLs Intravenous Contrast Given 02/04/24 1643)  metoCLOPramide (REGLAN) injection 10 mg (10 mg Intravenous Given 02/04/24 1741)    ED Course/ Medical Decision Making/ A&P  Medical Decision Making Amount and/or Complexity of Data Reviewed Labs: ordered. Radiology: ordered.  Risk Prescription drug management.   33 year old male presents for evaluation.  Please see HPI for further details.  On examination patient is afebrile and nontachycardic.  His lung sounds are clear bilaterally, he is not hypoxic.  His abdomen is soft and compressible throughout.  Neurological examinations at baseline.  He does have tenderness in the right lower quadrant of his abdomen.  There is no rebound or guarding.  Will assess with CT scan.  Will collect labs.  CBC with a slight leukocytosis to 11.1. E.  Lipase WNL.  Urinalysis unremarkable for all.  CMP without electrolyte derangement.  CT abdomen pelvis shows no acute process to account  for patient pain.  At the end of my shift, patient workup not complete.  Signed out to oncoming provider Geiple PA-C.   Final Clinical Impression(s) / ED Diagnoses Final diagnoses:  Right sided abdominal pain    Rx / DC Orders ED Discharge Orders          Ordered    dicyclomine (BENTYL) 20 MG tablet  2 times daily        02/04/24 2026    loperamide (IMODIUM) 2 MG capsule  4 times daily PRN        02/04/24 2026              Al Decant, PA-C 02/14/24 1946    Arby Barrette, MD 02/21/24 1124

## 2024-02-04 NOTE — Discharge Instructions (Addendum)
 Please read and follow all provided instructions.  Your diagnoses today include:  1. Right sided abdominal pain     Tests performed today include: Blood cell counts and platelets: Slightly high white blood cell count Kidney and liver function tests Pancreas function test (called lipase) Urine test to look for infection CT scan of your abdomen did not show any acute findings today which would explain the pain you are having  Vital signs. See below for your results today.   Medications prescribed:  Bentyl - medication for intestinal cramps and spasms  Take any prescribed medications only as directed.  Home care instructions:  Follow any educational materials contained in this packet.  Follow-up instructions: Please follow-up with your primary care provider in the next 7 days for further evaluation of your symptoms.  I have also put down the information for gastroenterology if you have to follow-up with them.  Return instructions:  SEEK IMMEDIATE MEDICAL ATTENTION IF: The pain does not go away or becomes severe  A temperature above 101F develops  Repeated vomiting occurs (multiple episodes)  The pain becomes localized to portions of the abdomen. The right side could possibly be appendicitis. In an adult, the left lower portion of the abdomen could be colitis or diverticulitis.  Blood is being passed in stools or vomit (bright red or black tarry stools)  You develop chest pain, difficulty breathing, dizziness or fainting, or become confused, poorly responsive, or inconsolable (young children) If you have any other emergent concerns regarding your health  Additional Information: Abdominal (belly) pain can be caused by many things. Your caregiver performed an examination and possibly ordered blood/urine tests and imaging (CT scan, x-rays, ultrasound). Many cases can be observed and treated at home after initial evaluation in the emergency department. Even though you are being discharged  home, abdominal pain can be unpredictable. Therefore, you need a repeated exam if your pain does not resolve, returns, or worsens. Most patients with abdominal pain don't have to be admitted to the hospital or have surgery, but serious problems like appendicitis and gallbladder attacks can start out as nonspecific pain. Many abdominal conditions cannot be diagnosed in one visit, so follow-up evaluations are very important.  Your vital signs today were: BP (!) 133/90   Pulse 91   Temp 98.3 F (36.8 C)   Resp 18   Ht 6' (1.829 m)   Wt 104.6 kg   SpO2 100%   BMI 31.28 kg/m  If your blood pressure (bp) was elevated above 135/85 this visit, please have this repeated by your doctor within one month. --------------

## 2024-02-04 NOTE — ED Triage Notes (Signed)
 Rt abd pain having diarrhea and some nausea and vomiting woke  up this am with this ,  last Bm diarrhea
